# Patient Record
Sex: Male | Born: 1970
Health system: Southern US, Academic
[De-identification: ages and names within clinical notes are randomized; demographics above are authoritative.]

## PROBLEM LIST (undated history)

## (undated) ENCOUNTER — Ambulatory Visit

## (undated) ENCOUNTER — Encounter

## (undated) ENCOUNTER — Telehealth
Attending: Student in an Organized Health Care Education/Training Program | Primary: Student in an Organized Health Care Education/Training Program

## (undated) ENCOUNTER — Encounter: Attending: Infectious Disease | Primary: Infectious Disease

## (undated) ENCOUNTER — Ambulatory Visit: Payer: PRIVATE HEALTH INSURANCE | Attending: Clinical | Primary: Clinical

## (undated) ENCOUNTER — Telehealth

## (undated) ENCOUNTER — Encounter
Attending: Student in an Organized Health Care Education/Training Program | Primary: Student in an Organized Health Care Education/Training Program

## (undated) ENCOUNTER — Ambulatory Visit: Attending: Pharmacist | Primary: Pharmacist

## (undated) ENCOUNTER — Encounter: Attending: Clinical | Primary: Clinical

## (undated) ENCOUNTER — Telehealth: Attending: Infectious Disease | Primary: Infectious Disease

## (undated) ENCOUNTER — Ambulatory Visit: Payer: PRIVATE HEALTH INSURANCE | Attending: Infectious Disease | Primary: Infectious Disease

## (undated) ENCOUNTER — Telehealth: Attending: Clinical | Primary: Clinical

## (undated) ENCOUNTER — Ambulatory Visit: Payer: PRIVATE HEALTH INSURANCE

## (undated) DIAGNOSIS — D849 Immunodeficiency, unspecified: Secondary | ICD-10-CM

## (undated) DIAGNOSIS — I10 Essential (primary) hypertension: Secondary | ICD-10-CM

## (undated) DIAGNOSIS — Z21 Asymptomatic human immunodeficiency virus [HIV] infection status: Secondary | ICD-10-CM

## (undated) DIAGNOSIS — B20 Human immunodeficiency virus [HIV] disease: Secondary | ICD-10-CM

## (undated) HISTORY — PX: LAPAROSCOPIC GASTRIC SLEEVE RESECTION: SHX5895

## (undated) HISTORY — DX: Human immunodeficiency virus (HIV) disease: B20

## (undated) HISTORY — DX: Asymptomatic human immunodeficiency virus (hiv) infection status: Z21

## (undated) MED ORDER — LOSARTAN 50 MG TABLET: Freq: Every day | ORAL | 0 days

---

## 2012-03-29 DIAGNOSIS — K648 Other hemorrhoids: Secondary | ICD-10-CM | POA: Insufficient documentation

## 2016-09-13 ENCOUNTER — Ambulatory Visit: Payer: Self-pay | Admitting: Physician Assistant

## 2016-10-11 ENCOUNTER — Ambulatory Visit: Payer: Self-pay | Admitting: Physician Assistant

## 2016-10-11 ENCOUNTER — Encounter (INDEPENDENT_AMBULATORY_CARE_PROVIDER_SITE_OTHER): Payer: Self-pay

## 2016-10-11 ENCOUNTER — Encounter: Payer: Self-pay | Admitting: Physician Assistant

## 2016-10-11 VITALS — BP 142/80 | HR 55 | Temp 97.7°F | Ht 69.0 in | Wt 191.0 lb

## 2016-10-11 DIAGNOSIS — B1910 Unspecified viral hepatitis B without hepatic coma: Secondary | ICD-10-CM | POA: Insufficient documentation

## 2016-10-11 DIAGNOSIS — R7303 Prediabetes: Secondary | ICD-10-CM | POA: Insufficient documentation

## 2016-10-11 DIAGNOSIS — B20 Human immunodeficiency virus [HIV] disease: Secondary | ICD-10-CM | POA: Insufficient documentation

## 2016-10-11 DIAGNOSIS — E781 Pure hyperglyceridemia: Secondary | ICD-10-CM | POA: Insufficient documentation

## 2016-10-11 DIAGNOSIS — Z0189 Encounter for other specified special examinations: Secondary | ICD-10-CM

## 2016-10-11 DIAGNOSIS — Z008 Encounter for other general examination: Secondary | ICD-10-CM

## 2016-10-11 DIAGNOSIS — Z0000 Encounter for general adult medical examination without abnormal findings: Secondary | ICD-10-CM

## 2016-10-11 NOTE — Progress Notes (Signed)
S: pt here for wellness physical and biometrics for insurance purposes, no complaints ros neg. PMH: hiv, nondetectable viral load, normal cd4 count   Social: nonsmoker, occasional etoh, no drugs  Fam: DM, HTN, leukemia  O: vitals wnl, nad, ENT wnl, neck supple no lymph, lungs c t a, cv rrr, abd soft nontender bs normal all 4 quads  A: wellness, biometric physical  P: labs today, f/u with reg doctor as needed

## 2016-10-12 LAB — CMP12+LP+TP+TSH+6AC+PSA+CBC…
A/G RATIO: 1.6 (ref 1.2–2.2)
ALBUMIN: 4.7 g/dL (ref 3.5–5.5)
ALK PHOS: 80 IU/L (ref 39–117)
ALT: 17 IU/L (ref 0–44)
AST: 20 IU/L (ref 0–40)
BASOS ABS: 0 10*3/uL (ref 0.0–0.2)
BUN/Creatinine Ratio: 12 (ref 9–20)
BUN: 14 mg/dL (ref 6–24)
Basos: 1 %
Bilirubin Total: 0.5 mg/dL (ref 0.0–1.2)
CALCIUM: 9.6 mg/dL (ref 8.7–10.2)
CHOLESTEROL TOTAL: 192 mg/dL (ref 100–199)
Chloride: 103 mmol/L (ref 96–106)
Chol/HDL Ratio: 3.6 ratio (ref 0.0–5.0)
Creatinine, Ser: 1.16 mg/dL (ref 0.76–1.27)
EOS (ABSOLUTE): 0 10*3/uL (ref 0.0–0.4)
Eos: 1 %
Estimated CHD Risk: 0.6 times avg. (ref 0.0–1.0)
FREE THYROXINE INDEX: 1.6 (ref 1.2–4.9)
GFR calc Af Amer: 86 mL/min/{1.73_m2} (ref >59)
GFR calc non Af Amer: 75 mL/min/{1.73_m2} (ref >59)
GGT: 22 IU/L (ref 0–65)
GLOBULIN, TOTAL: 2.9 g/dL (ref 1.5–4.5)
Glucose: 84 mg/dL (ref 65–99)
HDL: 54 mg/dL (ref >39)
HEMOGLOBIN: 14.1 g/dL (ref 13.0–17.7)
Hematocrit: 42.9 % (ref 37.5–51.0)
IMMATURE GRANULOCYTES: 0 %
Immature Grans (Abs): 0 10*3/uL (ref 0.0–0.1)
Iron: 109 ug/dL (ref 38–169)
LDH: 154 IU/L (ref 121–224)
LDL CALC: 118 mg/dL — AB (ref 0–99)
LYMPHS ABS: 2.2 10*3/uL (ref 0.7–3.1)
LYMPHS: 40 %
MCH: 27.4 pg (ref 26.6–33.0)
MCHC: 32.9 g/dL (ref 31.5–35.7)
MCV: 84 fL (ref 79–97)
MONOS ABS: 0.4 10*3/uL (ref 0.1–0.9)
Monocytes: 8 %
NEUTROS PCT: 50 %
Neutrophils Absolute: 2.7 10*3/uL (ref 1.4–7.0)
PLATELETS: 202 10*3/uL (ref 150–379)
PROSTATE SPECIFIC AG, SERUM: 0.3 ng/mL (ref 0.0–4.0)
Phosphorus: 3.2 mg/dL (ref 2.5–4.5)
Potassium: 4.3 mmol/L (ref 3.5–5.2)
RBC: 5.14 x10E6/uL (ref 4.14–5.80)
RDW: 14.3 % (ref 12.3–15.4)
Sodium: 144 mmol/L (ref 134–144)
T3 Uptake Ratio: 26 % (ref 24–39)
T4 TOTAL: 6.3 ug/dL (ref 4.5–12.0)
TRIGLYCERIDES: 102 mg/dL (ref 0–149)
TSH: 1.85 u[IU]/mL (ref 0.450–4.500)
Total Protein: 7.6 g/dL (ref 6.0–8.5)
Uric Acid: 5.7 mg/dL (ref 3.7–8.6)
VLDL Cholesterol Cal: 20 mg/dL (ref 5–40)
WBC: 5.3 10*3/uL (ref 3.4–10.8)

## 2017-01-16 ENCOUNTER — Ambulatory Visit: Payer: Self-pay | Admitting: Physician Assistant

## 2017-01-17 ENCOUNTER — Ambulatory Visit: Payer: Self-pay | Admitting: Physician Assistant

## 2017-01-17 ENCOUNTER — Encounter: Payer: Self-pay | Admitting: Physician Assistant

## 2017-01-17 VITALS — BP 150/89 | HR 63 | Temp 98.5°F | Resp 16

## 2017-01-17 DIAGNOSIS — H1132 Conjunctival hemorrhage, left eye: Secondary | ICD-10-CM

## 2017-01-17 NOTE — Progress Notes (Signed)
S: c/o left eye being red for 2 days, states he woke up and his eye was red, no known trauma, no pain, no drainage, matting, or itching  O: vitals wnl, perrl eomi, left eye has small hemorrhage in  lateral aspect, neck supple no lymph, lungs c t a, cv rrr  A: subconjunctival hemorrhage  P: reassurance, return if any problems

## 2017-04-06 ENCOUNTER — Ambulatory Visit: Payer: Self-pay | Admitting: Physician Assistant

## 2017-04-06 ENCOUNTER — Emergency Department
Admission: EM | Admit: 2017-04-06 | Discharge: 2017-04-06 | Disposition: A | Discharge status: Home / Self Care | Payer: PRIVATE HEALTH INSURANCE | Source: Intra-hospital | Admitting: Family Medicine

## 2017-04-09 ENCOUNTER — Encounter: Payer: Self-pay | Admitting: Physician Assistant

## 2017-04-09 ENCOUNTER — Ambulatory Visit: Payer: Self-pay | Admitting: Physician Assistant

## 2017-04-09 VITALS — BP 149/80 | HR 63 | Temp 98.5°F | Resp 16

## 2017-04-09 DIAGNOSIS — R197 Diarrhea, unspecified: Secondary | ICD-10-CM

## 2017-04-09 LAB — POCT URINALYSIS DIPSTICK
Blood, UA: NEGATIVE
Glucose, UA: NEGATIVE
Ketones, UA: NEGATIVE
Leukocytes, UA: NEGATIVE
Nitrite, UA: NEGATIVE
PH UA: 5.5 (ref 5.0–8.0)
UROBILINOGEN UA: 0.2 U/dL

## 2017-04-09 MED ORDER — CIPROFLOXACIN HCL 500 MG PO TABS: 500.0000 mg | ORAL_TABLET | Freq: Two times a day (BID) | ORAL | 0 refills | Status: DC

## 2017-04-09 MED ORDER — METRONIDAZOLE 500 MG PO TABS: 500.0000 mg | ORAL_TABLET | Freq: Three times a day (TID) | ORAL | 0 refills | Status: DC

## 2017-04-09 NOTE — Addendum Note (Signed)
Addended by: Catha BrowEACON, MONIQUE T on: 04/09/2017 01:54 PM   Modules accepted: Orders

## 2017-04-09 NOTE — Progress Notes (Signed)
S:  Pt c/o vomiting x 1, then diarrhea for close to a week, got dehydrated and went to the ER in [place], they gave him fluids and did stool cultures, results have not come back as far as he knows, has a friend with the same sx; , no fever/chills, no abd pain except for cramping with diarrhea; denies cp/sob, denies camping, bad food, recent antibiotics, or exposure to bad water Remainder ros neg  O:  Vitals wnl, nad, ENT wnl, neck supple no lymph, lungs c t a, cv rrr, abd soft nontender bs normal all 4 quads b/l, neuro intact  A:  diarrhea  P:  Reassurance, fluids, brat diet, immodium ad for diarrhea if needed, rx cipro 500mg  bid x 7d, flagyl 500mg  tid, call if worsening or go to ER

## 2017-07-04 MED ORDER — DARUNAVIR 800 MG-COBICISTAT 150 MG TABLET
ORAL_TABLET | Freq: Every day | ORAL | 1 refills | 0 days | Status: CP
Start: 2017-07-04 — End: 2017-08-06

## 2017-07-04 MED ORDER — TENOFOVIR DISOPROXIL FUMARATE 300 MG TABLET
ORAL_TABLET | Freq: Every day | ORAL | 1 refills | 0 days | Status: CP
Start: 2017-07-04 — End: 2017-08-06

## 2017-07-04 MED ORDER — DOLUTEGRAVIR 50 MG TABLET
ORAL_TABLET | Freq: Every day | ORAL | 1 refills | 0 days | Status: CP
Start: 2017-07-04 — End: 2017-08-06

## 2017-08-06 ENCOUNTER — Ambulatory Visit
Admit: 2017-08-06 | Discharge: 2017-08-06 | Payer: PRIVATE HEALTH INSURANCE | Attending: Infectious Disease | Primary: Infectious Disease

## 2017-08-06 ENCOUNTER — Ambulatory Visit: Admit: 2017-08-06 | Discharge: 2017-08-06 | Payer: PRIVATE HEALTH INSURANCE | Attending: Clinical | Primary: Clinical

## 2017-08-06 DIAGNOSIS — B20 Human immunodeficiency virus [HIV] disease: Secondary | ICD-10-CM

## 2017-08-06 DIAGNOSIS — B181 Chronic viral hepatitis B without delta-agent: Secondary | ICD-10-CM

## 2017-08-06 DIAGNOSIS — Z113 Encounter for screening for infections with a predominantly sexual mode of transmission: Principal | ICD-10-CM

## 2017-08-06 MED ORDER — DOLUTEGRAVIR 50 MG TABLET: 50 mg | tablet | Freq: Every day | 6 refills | 0 days | Status: AC

## 2017-08-06 MED ORDER — DARUNAVIR 800 MG-COBICISTAT 150 MG TABLET
ORAL_TABLET | Freq: Every day | ORAL | 6 refills | 0.00000 days | Status: CP
Start: 2017-08-06 — End: 2017-09-10

## 2017-08-06 MED ORDER — DARUNAVIR 800 MG-COBICISTAT 150 MG TABLET: 1 | tablet | Freq: Every day | 6 refills | 0 days | Status: AC

## 2017-08-06 MED ORDER — TENOFOVIR DISOPROXIL FUMARATE 300 MG TABLET: 300 mg | tablet | Freq: Every day | 6 refills | 0 days | Status: AC

## 2017-08-06 MED ORDER — DOLUTEGRAVIR 50 MG TABLET
ORAL_TABLET | Freq: Every day | ORAL | 6 refills | 0.00000 days | Status: CP
Start: 2017-08-06 — End: 2017-08-06

## 2017-08-06 MED ORDER — TENOFOVIR DISOPROXIL FUMARATE 300 MG TABLET
ORAL_TABLET | Freq: Every day | ORAL | 6 refills | 0.00000 days | Status: CP
Start: 2017-08-06 — End: 2017-09-10

## 2017-08-29 NOTE — Unmapped (Signed)
Per test claim for PREZCOBIX, TENOFOVIR, AND TIVICAY at the [facility], patient needs Medication Assistance Program for High Copay.

## 2017-09-10 MED ORDER — BICTEGRAVIR 50 MG-EMTRICITABINE 200 MG-TENOFOVIR ALAFENAM 25 MG TABLET: 1 | tablet | 6 refills | 0 days

## 2017-09-10 MED ORDER — BICTEGRAVIR 50 MG-EMTRICITABINE 200 MG-TENOFOVIR ALAFENAM 25 MG TABLET
ORAL_TABLET | Freq: Every day | ORAL | 6 refills | 0.00000 days | Status: CP
Start: 2017-09-10 — End: 2018-01-07

## 2017-09-10 MED FILL — BIKTARVY/50-200-25MG/TABS: BIKTARVY/50-200-25MG/TABS | 30 days supply | Qty: 30 | Fill #0

## 2017-09-10 NOTE — Unmapped (Signed)
Per test claim for Biktarvy at the [facility], patient needs Medication Assistance Program for High Copay.

## 2017-09-10 NOTE — Unmapped (Signed)
[facility]   Patient Onboarding/Medication Counseling    Gary Campbell is a 47 y.o. male with HIV who I am counseling today on initiation of therapy.    Medication: Gary Campbell    Verified patient's date of birth / HIPAA.      Education Provided: ??    Patient was counseled by Gary Campbell CPP.   Gary Campbell did not have any questions.    Handling precautions / disposal reviewed:  n/a.    Drug Interactions: other medications reviewed and up to date in Epic.  No drug interactions identified.    Comorbidities/Allergies: reviewed and up to date in Epic.    Verified therapy is appropriate and should continue      Delivery Information    Medication Campbell provided: Gary Campbell    Anticipated copay of $0.00 reviewed with patient. Verified delivery address in FSI and reviewed medication storage requirement.    Scheduled delivery date: 09/11/17    Explained that we ship using UPS or courier and this shipment will not require a signature.      Explained the services we provide at [facility] and that each month we would call to set up refills.  Stressed importance of returning phone calls so that we could ensure they receive their medications in time each month.  Informed patient that we should be setting up refills 7-10 days prior to when they will run out of medication.  Informed patient that welcome packet will be sent.      Patient verbalized understanding of the above information as well as how to contact the [facility] at [phone] option [phone] with any questions/concerns.  The [facility] is open Monday through Friday 8:30am-4:30pm.  A pharmacist is available 24/7 via pager to answer any clinical questions they may have.        Patient Specific Needs      ? Patient has no physical, cognitive, or cultural barriers.    ? Patient prefers to have medications discussed with  Patient     ? Patient is able to read and understand education materials at a high school level or above.    ? Patient's primary language is  Gary Campbell  [facility] Specialty Pharmacist

## 2017-09-10 NOTE — Unmapped (Signed)
[facility] Special[facility] Medication Referral: No PA required    Medication (Brand/Generic): BIKTARVY    Initial FSI Test Claim completed with resulted information below:  No PA required  Patient ABLE to fill at [facility]  Insurance Company:  [employer]  Anticipated Copay: $0 WITH COPAY CARD    As Co-pay is under $100 defined limit, per policy there will be no further investigation of need for financial assistance at this time unless patient requests. This referral has been communicated to the provider and handed off to the [facility] team for further processing and filling of prescribed medication.   ______________________________________________________________________  Please utilize this referral for viewing purposes as it will serve as the central location for all relevant documentation and updates.

## 2017-09-10 NOTE — Unmapped (Signed)
[name] is a 46 YOM who is managed at [facility] for HIV/HBV coinfected. He was previously seen at [facility]. He has transferred care to [facility] and as a result there is limited data available on his previous care. He is currently taking TDF + DTG +DRV/co. Unfortunately, [name] is experiencing diarrhea that is interfering with his ADL. He stopped all ART 09/05/17 stating that he could not take the diarrhea any more. He is reporting some improvement in his bowel habits after stopping his meds.     After discussion with his provider, the decision was made to change him to [facility] once daily (without regards to meals). I called the patient to request financial information on behalf of the [facility] Medication Assistance technician, [facility], who was having a difficult time contacting him. Additionally,  I also counseled the patient on the new medication. We discussed administration, adverse effects, and adherence.    Given the patient co-infected status, it is necessary for the patient to remain on therapy and minimize treatment interruptions. The patient has currently been off of meds x 6 days. Therefore, I will work with [facility] to have his new medication sent to him as soon as possible.     Time Spent During Encounter: 20 minutes    Tonie Griffith, PharmD, BCPS, AAHIVP, CPP  Infectious Disease Clinical Pharmacy Practitioner   [facility]   Direct line: [phone]

## 2017-10-02 NOTE — Unmapped (Signed)
[facility] Refill and Clinical Coordination Note: HIV     HIV Medication(s): Biktarvy  Additional Medication(s): none    Gary Campbell, DOB: 01-28-1971  Phone: [phone] (home) [phone] (work), Alternate phone contact: N/A  Shipping address: [address]  [place] [address]  Phone or address changes today?: No  All above HIPAA information verified.  Insurance changes? No    Completed refill and clinical call assessment today to schedule patient's medication shipment from the [facility] [phone]).      MEDICATION RECONCILIATION    Confirmed the medication and dosage are correct and have not changed: Yes, regimen is correct and unchanged.    Were there any changes to your medication(s) in the past month:  No, there are no changes reported at this time.    HIV-related labs:  Lab Results   Component Value Date/Time    HIVRS Not Detected 08/06/2017 10:33 AM    HIVCM  08/06/2017 10:33 AM      Comment:      HIV-1 quantification by real-time RT-PCR is performed using the Abbott RealTime  HIV-1 test. This test is FDA approved and can quantitate HIV-1 RNA over the  range of 40 - 1,000,000 copies/mL (1.60 log(10) -6.00 log(10) copies/mL). The reference range for this assay is Not Detected.    ACD4 325 (L) 08/06/2017 10:33 AM         ADHERENCE      Number of tablets of HIV Medication(s) at home: 7    Did you miss any doses in the past 4 weeks? No missed doses reported.  Adherence counseling provided? Not needed     SIDE EFFECT MANAGEMENT    Are you tolerating your medication?:  Gary Campbell reports side effects of mild headache in the morning but it is getting better.  Side effect management discussed: None      Therapy is appropriate and should be continued.    The patient will receive an FSI print out for each medication shipped and additional FDA Medication Guides as required.  Patient education from [employer] or Robet Leu may also be included in the shipment.    Evidence of clinical benefit: Unable to determine yet because he has only been on Biktarvy since 09/14/17 and lab tests to assess efficacy have not been performed.      FINANCIAL/SHIPPING    Delivery Scheduled: Yes, Expected medication delivery date: 10/04/17   Additional medications refilled: No additional medications/refills needed at this time.    Gary Campbell did not have any additional questions at this time.      Delivery address validated in FSI scheduling system: Yes, address listed above is correct.      We will follow up with patient monthly for standard refill processing and delivery.      Thank you,  Roderic Palau   [facility] Specialty Pharmacist

## 2017-10-03 MED FILL — BIKTARVY/50-200-25MG/TABS: BIKTARVY/50-200-25MG/TABS | 30 days supply | Qty: 30 | Fill #1

## 2017-11-06 NOTE — Unmapped (Signed)
[facility] Pharm[facility] Refill Coordination Note  Medication: BIKTARVY    Unable to reach patient to schedule shipment for medication being filled at [facility]. Left voicemail on phone.  As this is the 3rd unsuccessful attempt to reach the patient, no additional phone call attempts will be made at this time.      Phone numbers attempted: [phone]  ALSO Gary Campbell  Last scheduled delivery: SHIPPED 4/24    Please call the [facility] at [phone] (option [phone]) should you have any further questions.      Thanks,  [facility] Specialty Team

## 2017-11-09 ENCOUNTER — Ambulatory Visit
Admit: 2017-11-09 | Discharge: 2017-11-10 | Payer: PRIVATE HEALTH INSURANCE | Attending: Infectious Disease | Primary: Infectious Disease

## 2017-11-09 DIAGNOSIS — Z113 Encounter for screening for infections with a predominantly sexual mode of transmission: Secondary | ICD-10-CM

## 2017-11-09 DIAGNOSIS — B181 Chronic viral hepatitis B without delta-agent: Secondary | ICD-10-CM

## 2017-11-09 DIAGNOSIS — B20 Human immunodeficiency virus [HIV] disease: Principal | ICD-10-CM

## 2017-11-09 DIAGNOSIS — F5101 Primary insomnia: Secondary | ICD-10-CM

## 2017-11-09 LAB — COMPREHENSIVE METABOLIC PANEL
ALBUMIN: 4.4 g/dL (ref 3.5–5.0)
ALKALINE PHOSPHATASE: 69 U/L (ref 38–126)
ALT (SGPT): 22 U/L (ref 19–72)
ANION GAP: 10 mmol/L (ref 9–15)
AST (SGOT): 25 U/L (ref 19–55)
BILIRUBIN TOTAL: 0.6 mg/dL (ref 0.0–1.2)
BLOOD UREA NITROGEN: 9 mg/dL (ref 7–21)
BUN / CREAT RATIO: 10
CALCIUM: 9.5 mg/dL (ref 8.5–10.2)
CHLORIDE: 103 mmol/L (ref 98–107)
CO2: 29 mmol/L (ref 22.0–30.0)
CREATININE: 0.87 mg/dL (ref 0.70–1.30)
EGFR MDRD AF AMER: >=60 mL/min/{1.73_m2} (ref >=60)
EGFR MDRD NON AF AMER: >=60 mL/min/{1.73_m2} (ref >=60)
GLUCOSE RANDOM: 81 mg/dL (ref 65–179)
PROTEIN TOTAL: 7.9 g/dL (ref 6.5–8.3)
SODIUM: 142 mmol/L (ref 135–145)

## 2017-11-09 LAB — LEUKOCYTE ESTERASE UA: Lab: NEGATIVE

## 2017-11-09 LAB — CBC W/ AUTO DIFF
BASOPHILS ABSOLUTE COUNT: 0.1 10*9/L (ref 0.0–0.1)
BASOPHILS RELATIVE PERCENT: 1.1 %
EOSINOPHILS RELATIVE PERCENT: 0.6 %
HEMATOCRIT: 47.7 % (ref 41.0–53.0)
LARGE UNSTAINED CELLS: 3 % (ref 0–4)
LYMPHOCYTES ABSOLUTE COUNT: 1.3 10*9/L — ABNORMAL LOW (ref 1.5–5.0)
MEAN CORPUSCULAR HEMOGLOBIN CONC: 31.5 g/dL (ref 31.0–37.0)
MEAN CORPUSCULAR HEMOGLOBIN: 27.4 pg (ref 26.0–34.0)
MEAN CORPUSCULAR VOLUME: 87 fL (ref 80.0–100.0)
MEAN PLATELET VOLUME: 8.4 fL (ref 7.0–10.0)
MONOCYTES ABSOLUTE COUNT: 0.4 10*9/L (ref 0.2–0.8)
NEUTROPHILS ABSOLUTE COUNT: 3.7 10*9/L (ref 2.0–7.5)
NEUTROPHILS RELATIVE PERCENT: 65.1 %
PLATELET COUNT: 207 10*9/L (ref 150–440)
RED BLOOD CELL COUNT: 5.48 10*12/L (ref 4.50–5.90)
RED CELL DISTRIBUTION WIDTH: 13.1 % (ref 12.0–15.0)
WBC ADJUSTED: 5.7 10*9/L (ref 4.5–11.0)

## 2017-11-09 LAB — LYMPH MARKER LIMITED,FLOW
ABSOLUTE CD3 CNT: 986 {cells}/uL (ref 915–3400)
ABSOLUTE CD4 CNT: 279 {cells}/uL — ABNORMAL LOW (ref 510–2320)
CD3% (T CELLS)": 67 % (ref 61–86)
CD4% (T HELPER)": 19 % — ABNORMAL LOW (ref 34–58)
CD4:CD8 RATIO: 0.4 — ABNORMAL LOW (ref 0.9–4.8)
CD8% T SUPPRESR": 46 % — ABNORMAL HIGH (ref 12–38)

## 2017-11-09 LAB — FREE T4: Thyroxine.free:MCnc:Pt:Ser/Plas:Qn:: 0.97

## 2017-11-09 LAB — PLATELET COUNT: Lab: 207

## 2017-11-09 LAB — URINALYSIS
BACTERIA: NONE SEEN /HPF
BILIRUBIN UA: NEGATIVE
BLOOD UA: NEGATIVE
GLUCOSE UA: NEGATIVE
LEUKOCYTE ESTERASE UA: NEGATIVE
NITRITE UA: NEGATIVE
PH UA: 6 (ref 5.0–9.0)
RBC UA: <1 /HPF (ref <=3)
SPECIFIC GRAVITY UA: 1.019 (ref 1.003–1.030)
SQUAMOUS EPITHELIAL: <1 /HPF (ref 0–5)
UROBILINOGEN UA: 0.2
WBC UA: 1 /HPF (ref <=2)

## 2017-11-09 LAB — ABSOLUTE CD4 CNT: Cells.CD3+CD4+:NCnc:Pt:XXX:Qn:: 279 — ABNORMAL LOW

## 2017-11-09 LAB — THYROID STIMULATING HORMONE: Thyrotropin:ACnc:Pt:Ser/Plas:Qn:: 1.18

## 2017-11-09 LAB — CO2: Carbon dioxide:SCnc:Pt:Ser/Plas:Qn:: 29

## 2017-11-09 MED ORDER — HYDROCHLOROTHIAZIDE 25 MG TABLET
ORAL_TABLET | Freq: Every day | ORAL | 11 refills | 0.00000 days | Status: CP
Start: 2017-11-09 — End: 2018-07-08

## 2017-11-09 NOTE — Unmapped (Signed)
PCP:  Matilde Haymaker, MD    11/09/2017    This is a RETURN visit for this 47 y.o. male with the following diagnoses:    Patient Active Problem List    Diagnosis Date Noted   ??? HIV disease (CMS-HCC) 08/06/2017   ??? HBV (hepatitis B virus) infection 08/06/2017       ASSESSMENT/PLAN:    HIV  ?? Dx'd in 1989.   ?? Was cared for here until ~2003. Most recently cared for at [facility] by Matilde Haymaker  ?? Was on Viread+Tivicay+Prezcobix when switched over from [facility]  ?? Has HBV co-infection  ?? Documented K103N and 184V  ?? HIV RNA was <20  ?? CD4 325 at time  ?? I changed him to [facility] which he started 1 month ago  ?? Says he has had slowly progressing HA but also increase in BP/ Today is 180/108. Has hx of elevated BP that was improved with gastric sleeve 3 years ago. See below  ?? PLAN:  ?? HA could be due to [facility] but also elevated BP.   Will see if HA improves with BP treatment  ?? If not, consider TDF/FTC + Dolutegravir (which he tolerated) and eventual switch to TAF/FTC if tolerated.   ?? Checking HIV related and safety labs today.    HTN  ?? Hx in past  ?? Was on BP med that caused cough (ACEi probably)   ?? Then got gastric sleeve and wt dropped as did BP  ?? Has strong family hx  ?? Will try HCTZ 25 mg daily to start  ?? He will measure BP at home and message results to me in MyChart.   ?? Check UA    HBV Ag+; DNA undetectable  ?? Surface antigen positive  ?? HBV DNA undetectable.   ?? Core total was neg  ?? Will check e antigen and antibody and repeat core antibody  ?? Needs q6 month ultrasounds. Will order.    Insomnia  ?? Good sleep hygiene. Only 1 cup am coffee and no other caffeine   ?? Started before Tivicay and after partner's death  ?? Issue is falling asleep  ?? Melatonin OTC did not work  ?? Sleeps ~4 hours per night. Trouble falling asleep.  ?? BUT, says he feels rested in am.   ?? He may be someone who needs relatively little sleep. Will ask Sleep Clinic to opine.     Mental Health  ?? Stable and good    Health Maintenance   ?? Immunizations: Had flu shot. Get [facility]  ?? Cancer Screening: Family hx of early colon cancer. Has had colonoscopy and may be due for repeat. US Liver.   ?? Lipids: Check  ?? Other:   ?? Had gastric sleeve 3 years ago. Has kept wt off. Not on PPIs.  ?? Follow BP  ?? No meds other than ART  ?? Has 3/6 systolic murmur - known and has had echocardiogram he said without concerns.     Will follow up in 1 month.       Chief Complaint: Urgent HIV follow-up    HPI:  [name] returns for an urgent appointment. Has HA and elevated BP. He is concerned that this is related to his new HIV med.       ROS:   No fever chills, sweats, nausea, vomiting, diarrhea, rash, joint pain. Pos for headache (new), insomnia (chronic)    All other systems are negative.    ALLERGIES:  Patient has no known allergies.    MEDICATIONS:  Current Outpatient Medications   Medication Sig Dispense Refill   ??? bictegrav-emtricit-tenofov ala (BIKTARVY) 50-200-25 mg tablet Take 1 tablet by mouth daily. 30 tablet 6   ??? ibuprofen (ADVIL,MOTRIN) 200 MG tablet Take 400 mg by mouth every six (6) hours as needed for pain.     ??? hydroCHLOROthiazide (HYDRODIURIL) 25 MG tablet Take 1 tablet (25 mg total) by mouth daily. 30 tablet 11     No current facility-administered medications for this visit.        PHYSICAL EXAM:  Vitals:    11/09/17 1303   BP: 180/108   Pulse: 54   Temp: 37.1 ??C (98.7 ??F)     CONSTITUTIONAL: Alert,well appearing, no distress  HEENT: Moist mucous membranes, oropharynx clear without erythema or exudate  NECK: Supple, no lymphadenopathy  CARDIOVASCULAR: Regular, 3/6 SEM.   PULM: Clear to auscultation bilaterally  GASTROINTESTINAL: Soft, active bowel sounds, nontender  EXTREMITIES: No lower extremity edema bilaterally, dorsalis pedis pulses 2+ bilaterally   SKIN: No rashes or lesions  NEUROLOGIC: No focal motor or sensory deficits    DATA:    Results for orders placed or performed in visit on 08/06/17   Comprehensive Metabolic Panel   Result Value Ref Range    Sodium 142 135 - 145 mmol/L    Potassium 3.9 3.5 - 5.0 mmol/L    Chloride 104 98 - 107 mmol/L    CO2 32.0 (H) 22.0 - 30.0 mmol/L    BUN 16 7 - 21 mg/dL    Creatinine 1.61 0.96 - 1.30 mg/dL    BUN/Creatinine Ratio 13     EGFR MDRD Non Af Amer >=60 >=60 mL/min/1.33m2    EGFR MDRD Af Amer >=60 >=60 mL/min/1.74m2    Anion Gap 6 (L) 9 - 15 mmol/L    Glucose 70 65 - 179 mg/dL    Calcium 9.6 8.5 - 04.5 mg/dL    Albumin 4.5 3.5 - 5.0 g/dL    Total Protein 7.7 6.5 - 8.3 g/dL    Total Bilirubin 0.6 0.0 - 1.2 mg/dL    AST 28 19 - 55 U/L    ALT 25 19 - 72 U/L    Alkaline Phosphatase 62 38 - 126 U/L   Hepatitis B Surface Antigen   Result Value Ref Range    Hepatitis B Surface Ag (A) Nonreactive     For final results see Hepatitis B Surface Antigen Neutralization   Hepatitis B Surface Antibody   Result Value Ref Range    Hep B S Ab Nonreactive Nonreactive, Grayzone    Hepatitis B Surface Ab Quant <8.00 <8.00 m(IU)/mL   Hepatitis B Core Antibody, Total   Result Value Ref Range    Hep B Core Total Ab Nonreactive Nonreactive   Hepatitis B DNA, PCR, Quantitative   Result Value Ref Range    HBV DNA Quant Not Detected Not Detected    HBV DNA Comment     HEP.B NEUTRALIZATION   Result Value Ref Range    Hep B S Ag Neutralization (A) Reactive by screening assay;non-confirmable by neutralization assay     Hepatitis B Surface Antigen POSITIVE;confirmed by neutralization   CBC w/ Differential   Result Value Ref Range    WBC 5.2 4.5 - 11.0 10*9/L    RBC 4.99 4.50 - 5.90 10*12/L    HGB 13.6 13.5 - 17.5 g/dL    HCT 40.9 81.1 - 91.4 %    MCV 83.4  80.0 - 100.0 fL    MCH 27.3 26.0 - 34.0 pg    MCHC 32.7 31.0 - 37.0 g/dL    RDW 16.1 09.6 - 04.5 %    MPV 8.3 7.0 - 10.0 fL    Platelet 218 150 - 440 10*9/L    Neutrophils % 56.1 %    Lymphocytes % 30.9 %    Monocytes % 7.2 %    Eosinophils % 0.6 %    Basophils % 0.9 %    Absolute Neutrophils 2.9 2.0 - 7.5 10*9/L    Absolute Lymphocytes 1.6 1.5 - 5.0 10*9/L    Absolute Monocytes 0.4 0.2 - 0.8 10*9/L    Absolute Eosinophils 0.0 0.0 - 0.4 10*9/L    Absolute Basophils 0.1 0.0 - 0.1 10*9/L    Large Unstained Cells 4 0 - 4 %    Hypochromasia Slight (A) Not Present        Creatinine   Date Value Ref Range Status   08/06/2017 1.23 0.70 - 1.30 mg/dL Final   40/98/1191 4.78 0.70 - 1.30 mg/dL Final   29/56/2130 8.65 0.80 - 1.30 mg/dL Final     Comment:     Interpretive Data: * This creatinine method is traceable to a GC-IDMS method and NIST standard reference material. (Entered 04/06/2011 17:09:22 By George Hugh       Triglycerides   Date Value Ref Range Status   08/06/2017 122 1 - 149 mg/dL Final     HDL   Date Value Ref Range Status   08/06/2017 49 40 - 59 mg/dL Final     LDL Calculated   Date Value Ref Range Status   08/06/2017 127 (H) 60 - 99 mg/dL Final     Comment:       NHLBI Recommended Ranges, LDL Cholesterol, for Adults (20+yrs) (ATPIII), mg/dL  Optimal              <784  Near Optimal        100-129  Borderline High     130-159  High                160-189  Very High            >=190  NHLBI Recommended Ranges, LDL Cholesterol, for Children (2-19 yrs), mg/dL  Desirable            <696  Borderline High     110-129  High                 >=130         IMAGING STUDIES:  None

## 2017-11-09 NOTE — Unmapped (Signed)
[facility] Refill Coordination Note  Specialty Medication(s): Biktarvy  Additional Medications shipped: none    [name], DOB: [birth year]  Phone: [phone] (home) [phone] (work), Alternate phone contact: N/A  Phone or address changes today?: No  All above HIPAA information was verified with patient.  Shipping Address: [address]  [place] [address]   Insurance changes? No    Completed refill call assessment today to schedule patient's medication shipment from the [facility] [phone]).      Confirmed the medication and dosage are correct and have not changed: Yes, regimen is correct and unchanged.    Confirmed patient started or stopped the following medications in the past month:  No, there are no changes reported at this time.    Are you tolerating your medication?:  [name] reports tolerating the medication.    ADHERENCE    (Below is required for Medicare Part B or Transplant patients only - per drug):   How many tablets were dispensed last month: 30  Patient currently has 3 tablets remaining.    Did you miss any doses in the past 4 weeks? Yes.  [name] reports missing 4 days of medication therapy in the last 4 weeks.  [name] reports he was told to hold his dose until he spoke with his doctor as the cause of their non-adherance.    FINANCIAL/SHIPPING    Delivery Scheduled: Yes, Expected medication delivery date: 11/12/17     The patient will receive an FSI print out for each medication shipped and additional FDA Medication Guides as required.  Patient education from [employer] or Robet Leu may also be included in the shipment    [name] did not have any additional questions at this time.    Delivery address validated in FSI scheduling system: Yes, address listed in FSI is correct.    We will follow up with patient monthly for standard refill processing and delivery.      Thank you,  Roderic Palau   [facility] Specialty Pharmacist

## 2017-11-10 MED FILL — BIKTARVY/50-200-25MG/TABS: BIKTARVY/50-200-25MG/TABS | 30 days supply | Qty: 30 | Fill #2

## 2017-11-12 LAB — SYPHILIS RPR SCREEN: Reagin Ab:PrThr:Pt:Ser:Ord:RPR: NONREACTIVE

## 2017-11-12 LAB — HEPATITIS B CORE TOTAL ANTIBODY: Hepatitis B virus core Ab:PrThr:Pt:Ser/Plas:Ord:IA: NONREACTIVE

## 2017-11-13 LAB — HEPATITIS BE ANTIBODY: Hepatitis B virus little e Ab.IgG:PrThr:Pt:Ser:Ord:IA: NEGATIVE

## 2017-11-13 LAB — HIV RNA, QUANTITATIVE, PCR: HIV RNA: 41 {copies}/mL — ABNORMAL HIGH (ref <0)

## 2017-11-13 LAB — HIV RNA COMMENT: Lab: 0

## 2017-11-13 LAB — HEPATITIS BE ANTIGEN: Hepatitis B virus little e Ag:PrThr:Pt:Ser/Plas:Ord:IA: POSITIVE — AB

## 2017-11-22 LAB — HBV DNA: Hepatitis B virus DNA:ACnc:Pt:Ser/Plas:Qn:Probe.amp.tar: 22 — ABNORMAL HIGH

## 2017-11-22 LAB — HEPATITIS B DNA, ULTRAQUANTITATIVE, PCR: HBV DNA: 22 [IU]/mL — ABNORMAL HIGH

## 2017-11-27 ENCOUNTER — Ambulatory Visit: Payer: Self-pay | Admitting: Family Medicine

## 2017-11-27 VITALS — BP 154/99 | HR 66 | Resp 17 | Ht 69.0 in | Wt 200.0 lb

## 2017-11-27 DIAGNOSIS — Z008 Encounter for other general examination: Secondary | ICD-10-CM

## 2017-11-27 DIAGNOSIS — Z0189 Encounter for other specified special examinations: Principal | ICD-10-CM

## 2017-11-27 NOTE — Progress Notes (Signed)
Subjective: Annual biometrics screening  Patient presents for his annual biometric screening. Patient reports eating a healthy, well-rounded diet and getting regular physical activity.  Patient regularly sees his primary care provider. Patient works for [employer]. Patient denies any other issues or concerns.   Review of Systems Unremarkable  Objective  Physical Exam General: Awake, alert and oriented. No acute distress. Well developed, hydrated and nourished. Appears stated age.  HEENT: Supple neck without adenopathy. Sclera is non-icteric. The ear canal is clear without discharge. The tympanic membrane is normal in appearance with normal landmarks and cone of light. Nasal mucosa is pink and moist. Oral mucosa is pink and moist. The pharynx is normal in appearance without tonsillar swelling or exudates.  Skin: Skin in warm, dry and intact without rashes or lesions. Appropriate color for ethnicity. Cardiac: Heart rate and rhythm are normal. No murmurs, gallops, or rubs are auscultated.  Respiratory: The chest wall is symmetric and without deformity. No signs of respiratory distress. Lung sounds are clear in all lobes bilaterally without rales, ronchi, or wheezes.  Neurological: The patient is awake, alert and oriented to person, place, and time with normal speech.  Memory is normal and thought processes intact. No gait abnormalities are appreciated.  Psychiatric: Appropriate mood and affect.   Assessment Annual biometrics screening  Plan  Lipid panel and nonfasting blood sugar pending. Encouraged routine visits with primary care provider.  Patient blood pressure is 154/99 today.  Patient monitors this regularly and reports his blood pressure to his primary care provider.  Patient reports his blood pressure was in the 120s over 80s yesterday.  Advised patient to continue monitoring this regularly and report abnormal values with primary care provider.  Discussed normal values. Encouraged patient to  get regular exercise and eat a healthy, well-rounded diet.

## 2017-11-28 LAB — LIPID PANEL
CHOLESTEROL TOTAL: 201 mg/dL — AB (ref 100–199)
Chol/HDL Ratio: 4.3 ratio (ref 0.0–5.0)
HDL: 47 mg/dL (ref >39)
LDL CALC: 133 mg/dL — AB (ref 0–99)
Triglycerides: 104 mg/dL (ref 0–149)
VLDL Cholesterol Cal: 21 mg/dL (ref 5–40)

## 2017-11-28 LAB — GLUCOSE, RANDOM: Glucose: 79 mg/dL (ref 65–99)

## 2017-11-28 NOTE — Progress Notes (Signed)
David Mccann, Will you call the patient and inform them that their lipid panel and fasting blood sugar came back?  Everything is normal, with the exception of his total cholesterol and LDL cholesterol.  The total cholesterol is elevated at 201, normal values are between 100 and 199. The LDL cholesterol ("bad cholesterol") is elevated at 133, normal values are below 99.  These have both increased since last year.  Please advise the patient to follow-up with their primary care provider regarding these results.

## 2017-12-14 MED FILL — BIKTARVY/50-200-25MG/TABS: BIKTARVY/50-200-25MG/TABS | 30 days supply | Qty: 30 | Fill #3

## 2017-12-14 NOTE — Unmapped (Signed)
[facility] Pharm[facility] Refill Coordination Note    Specialty Medication(s) to be Shipped:   Infectious Disease: Biktarvy    Other medication(s) to be shipped: Gary Campbell, DOB: [birth year]  Phone: [phone] (home) [phone] (work)  Shipping Address: [address]  [place] [address]    All above HIPAA information was verified with patient.     Completed refill call assessment today to schedule patient's medication shipment from the [facility] [phone]).       Specialty medication(s) and dose(s) confirmed: Regimen is correct and unchanged.   Changes to medications: [name] reports no changes reported at this time.  Changes to insurance: No  Questions for the pharmacist: No    The patient will receive an FSI print out for each medication shipped and additional FDA Medication Guides as required.  Patient education from [employer] or Robet Leu may also be included in the shipment.    DISEASE-SPECIFIC INFORMATION        N/A    ADHERENCE     Medication Adherence    Patient reported X missed doses in the last month:  0          Refill Coordination    Has the Patients' Contact Information Changed:  No  Is the Shipping Address Different:  No         MEDICARE PART B DOCUMENTATION     Not Applicable    SHIPPING     Shipping address confirmed in FSI.     Delivery Scheduled: Yes, Expected medication delivery date: 12/17/17 via UPS or courier.     Mitzi Davenport   [facility] Specialty Technician

## 2018-01-07 ENCOUNTER — Ambulatory Visit
Admit: 2018-01-07 | Discharge: 2018-01-08 | Payer: PRIVATE HEALTH INSURANCE | Attending: Infectious Disease | Primary: Infectious Disease

## 2018-01-07 DIAGNOSIS — Z1211 Encounter for screening for malignant neoplasm of colon: Secondary | ICD-10-CM

## 2018-01-07 DIAGNOSIS — B181 Chronic viral hepatitis B without delta-agent: Secondary | ICD-10-CM

## 2018-01-07 DIAGNOSIS — B20 Human immunodeficiency virus [HIV] disease: Principal | ICD-10-CM

## 2018-01-07 LAB — COMPREHENSIVE METABOLIC PANEL
ALBUMIN: 4.3 g/dL (ref 3.5–5.0)
ALKALINE PHOSPHATASE: 65 U/L (ref 38–126)
ALT (SGPT): 21 U/L (ref 19–72)
ANION GAP: 5 mmol/L — ABNORMAL LOW (ref 9–15)
AST (SGOT): 28 U/L (ref 19–55)
BILIRUBIN TOTAL: 0.6 mg/dL (ref 0.0–1.2)
BLOOD UREA NITROGEN: 13 mg/dL (ref 7–21)
BUN / CREAT RATIO: 13
CALCIUM: 9.5 mg/dL (ref 8.5–10.2)
CHLORIDE: 98 mmol/L (ref 98–107)
CREATININE: 1 mg/dL (ref 0.70–1.30)
EGFR CKD-EPI AA MALE: >90 mL/min/{1.73_m2} (ref >=60)
EGFR CKD-EPI NON-AA MALE: 88 mL/min/{1.73_m2} (ref >=60)
POTASSIUM: 3.8 mmol/L (ref 3.5–5.0)
PROTEIN TOTAL: 7.6 g/dL (ref 6.5–8.3)
SODIUM: 138 mmol/L (ref 135–145)

## 2018-01-07 LAB — CBC W/ AUTO DIFF
BASOPHILS ABSOLUTE COUNT: 0.1 10*9/L (ref 0.0–0.1)
BASOPHILS RELATIVE PERCENT: 0.9 %
EOSINOPHILS ABSOLUTE COUNT: 0.1 10*9/L (ref 0.0–0.4)
EOSINOPHILS RELATIVE PERCENT: 1 %
HEMATOCRIT: 45 % (ref 41.0–53.0)
HEMOGLOBIN: 14.7 g/dL (ref 13.5–17.5)
LARGE UNSTAINED CELLS: 3 % (ref 0–4)
LYMPHOCYTES ABSOLUTE COUNT: 1.7 10*9/L (ref 1.5–5.0)
LYMPHOCYTES RELATIVE PERCENT: 32.6 %
MEAN CORPUSCULAR HEMOGLOBIN: 27.8 pg (ref 26.0–34.0)
MEAN CORPUSCULAR VOLUME: 85.4 fL (ref 80.0–100.0)
MONOCYTES ABSOLUTE COUNT: 0.4 10*9/L (ref 0.2–0.8)
MONOCYTES RELATIVE PERCENT: 8.5 %
NEUTROPHILS ABSOLUTE COUNT: 2.8 10*9/L (ref 2.0–7.5)
NEUTROPHILS RELATIVE PERCENT: 53.6 %
RED BLOOD CELL COUNT: 5.27 10*12/L (ref 4.50–5.90)
RED CELL DISTRIBUTION WIDTH: 13.1 % (ref 12.0–15.0)

## 2018-01-07 LAB — LYMPH MARKER LIMITED,FLOW
ABSOLUTE CD3 CNT: 1244 {cells}/uL (ref 915–3400)
CD3% (T CELLS)": 67 % (ref 61–86)
CD4% (T HELPER)": 17 % — ABNORMAL LOW (ref 34–58)
CD8% T SUPPRESR": 49 % — ABNORMAL HIGH (ref 12–38)

## 2018-01-07 LAB — BUN / CREAT RATIO: Urea nitrogen/Creatinine:MRto:Pt:Ser/Plas:Qn:: 13

## 2018-01-07 LAB — HBSAG NEUT: Hepatitis B virus surface Ag:PrThr:Pt:Ser/Plas:Ord:Neut: POSITIVE — AB

## 2018-01-07 LAB — HEPATITIS B SURFACE ANTIGEN

## 2018-01-07 LAB — BASOPHILS ABSOLUTE COUNT: Lab: 0.1

## 2018-01-07 LAB — ABSOLUTE CD4 CNT: Cells.CD3+CD4+:NCnc:Pt:XXX:Qn:: 316 — ABNORMAL LOW

## 2018-01-07 MED ORDER — HYDROCHLOROTHIAZIDE 25 MG TABLET
ORAL_TABLET | Freq: Every day | ORAL | 10 refills | 0 days | Status: CP
Start: 2018-01-07 — End: ?

## 2018-01-07 MED ORDER — SILDENAFIL 100 MG TABLET
ORAL_TABLET | Freq: Once | ORAL | 6 refills | 0 days | Status: CP | PRN
Start: 2018-01-07 — End: 2018-07-08

## 2018-01-07 MED ORDER — BICTEGRAVIR 50 MG-EMTRICITABINE 200 MG-TENOFOVIR ALAFENAM 25 MG TABLET
ORAL_TABLET | Freq: Every day | ORAL | 6 refills | 0.00000 days | Status: CP
Start: 2018-01-07 — End: 2018-01-29

## 2018-01-07 NOTE — Unmapped (Signed)
PCP:  Gary Haymaker, MD    01/07/2018    This is a RETURN visit for this 47 y.o. male with the following diagnoses:    Patient Active Problem List    Diagnosis Date Noted   ??? HIV disease (CMS-HCC) 08/06/2017   ??? HBV (hepatitis B virus) infection 08/06/2017       ASSESSMENT/PLAN:    HIV  ?? Dx'd in 1989.   ?? Was cared for here until ~2003. Most recently cared for at [facility] by Gary Campbell  ?? Was on Viread+Tivicay+Prezcobix when switched over from [facility]  ?? Has HBV co-infection  ?? Documented K103N and 184V  ?? HIV RNA was <20  ?? CD4 325 at time  ?? I changed him to [facility] which he started ~3 months ago  ?? Low level viremia last visit.  ?? PLAN:  ?? Recheck labs today  ?? If elevated will look at getting Archive DNA     HTN  ?? Better on HCTZ 25 mg daily  ?? Some ED since starting (lost am erections)  ?? Does not want to change his BP med  ?? Asks for trial of Viagra. If works daily Cialis may be an option if covered.     HBV Surface Ag+; e Antigen+; DNA detectable at low level  ?? Pre-core mutant   ?? On Biktarvy which contains TAF and FTC   ?? Needs q6 month ultrasounds. Will order.  ?? Recheck labs    Insomnia  ?? Good sleep hygiene. Only 1 cup am coffee and no other caffeine   ?? Started before Tivicay and after partner's death  ?? Issue is falling asleep  ?? Melatonin OTC did not work  ?? Sleeps ~4 hours per night. Trouble falling asleep.  ?? BUT, says he feels rested in am.   ?? He may be someone who needs relatively little sleep.     Mental Health  ?? Stable and good    Health Maintenance   ?? Immunizations: Had flu shot. Get records from [facility]  ?? Cancer Screening: Family hx of early colon cancer. Has had colonoscopy and due for repeat. US Liver.   ?? Lipids: Not horrible but lowish HDL. AASCVD 10y risk is 9.6%. Will discuss with him statin.  ?? Other:   ?? Had gastric sleeve 3 years ago. Has kept wt off. Not on PPIs.  ?? Follow BP  ?? No meds other than ART and HCTZ  ?? Has 3/6 systolic murmur - known and has had echocardiogram he said without concerns.     Will follow up in 6 months.       Chief Complaint: HIV follow-up    HPI:  Gary Campbell returns for an appointment.       ROS:   No fever chills, sweats, nausea, vomiting, diarrhea, rash, joint pain. Pos for  insomnia (chronic) and ED (new)    All other systems are negative.    ALLERGIES:  Patient has no known allergies.    MEDICATIONS:  Current Outpatient Medications   Medication Sig Dispense Refill   ??? bictegrav-emtricit-tenofov ala (BIKTARVY) 50-200-25 mg tablet Take 1 tablet by mouth daily. 30 tablet 6   ??? hydroCHLOROthiazide (HYDRODIURIL) 25 MG tablet Take 1 tablet (25 mg total) by mouth daily. 90 tablet 10   ??? ibuprofen (ADVIL,MOTRIN) 200 MG tablet Take 400 mg by mouth every six (6) hours as needed for pain.     ??? hydroCHLOROthiazide (HYDRODIURIL) 25 MG tablet Take 1 tablet (25  mg total) by mouth daily. 30 tablet 11   ??? sildenafil (VIAGRA) 100 MG tablet Take 1 tablet (100 mg total) by mouth once as needed for erectile dysfunction. for up to 1 dose 1 tablet 6     No current facility-administered medications for this visit.        PHYSICAL EXAM:  Vitals:    01/07/18 0817   BP: 134/86   Pulse: 55   Temp: 36.8 ??C (98.3 ??F)     CONSTITUTIONAL: Alert,well appearing, no distress  HEENT: Moist mucous membranes, oropharynx clear without erythema or exudate  NECK: Supple, no lymphadenopathy  CARDIOVASCULAR: Regular, 3/6 SEM.   PULM: Clear to auscultation bilaterally  GASTROINTESTINAL: Soft, active bowel sounds, nontender  EXTREMITIES: No lower extremity edema bilaterally, dorsalis pedis pulses 2+ bilaterally   SKIN: No rashes or lesions  NEUROLOGIC: No focal motor or sensory deficits    DATA:    Results for orders placed or performed in visit on 11/09/17   HIV RNA, Quantitative, PCR   Result Value Ref Range    HIV RNA Quant Result  Not Detected    HIV RNA 41 (H) <0 copies/mL    HIV RNA Log(10) 1.61 (H) <0.00 log copies/mL    HIV RNA Comment     Urinalysis   Result Value Ref Range    Color, UA Yellow Clarity, UA Clear     Specific Gravity, UA 1.019 1.003 - 1.030    pH, UA 6.0 5.0 - 9.0    Leukocyte Esterase, UA Negative Negative    Nitrite, UA Negative Negative    Protein, UA Trace (A) Negative    Glucose, UA Negative Negative    Ketones, UA Negative Negative    Urobilinogen, UA 0.2 mg/dL 0.2 mg/dL, 1.0 mg/dL    Bilirubin, UA Negative Negative    Blood, UA Negative Negative    RBC, UA <1 <=3 /HPF    WBC, UA 1 <=2 /HPF    Squam Epithel, UA <1 0 - 5 /HPF    Bacteria, UA None Seen None Seen /HPF    Mucus, UA Few (A) None Seen /HPF   RPR   Result Value Ref Range    RPR Nonreactive Nonreactive   Comprehensive Metabolic Panel   Result Value Ref Range    Sodium 142 135 - 145 mmol/L    Potassium 3.8 3.5 - 5.0 mmol/L    Chloride 103 98 - 107 mmol/L    CO2 29.0 22.0 - 30.0 mmol/L    BUN 9 7 - 21 mg/dL    Creatinine 1.61 0.96 - 1.30 mg/dL    BUN/Creatinine Ratio 10     EGFR MDRD Non Af Amer >=60 >=60 mL/min/1.51m2    EGFR MDRD Af Amer >=60 >=60 mL/min/1.24m2    Anion Gap 10 9 - 15 mmol/L    Glucose 81 65 - 179 mg/dL    Calcium 9.5 8.5 - 04.5 mg/dL    Albumin 4.4 3.5 - 5.0 g/dL    Total Protein 7.9 6.5 - 8.3 g/dL    Total Bilirubin 0.6 0.0 - 1.2 mg/dL    AST 25 19 - 55 U/L    ALT 22 19 - 72 U/L    Alkaline Phosphatase 69 38 - 126 U/L   Hepatitis B DNA, PCR, Quantitative   Result Value Ref Range    HBV DNA 22 (H) 0 IU/mL    HBV DNA Log10 1.34 (H) <0.00 log IU/mL    HBV DNA Comment  TSH   Result Value Ref Range    TSH 1.180 0.600 - 3.300 uIU/mL   T4, Free   Result Value Ref Range    Free T4 0.97 0.71 - 1.40 ng/dL   Hepatitis B e Antigen   Result Value Ref Range    Hep B E Ag Positive (A) Negative   Hepatitis B e Antibody   Result Value Ref Range    Hep B E Ab Negative Negative   Hepatitis B Core Antibody, Total   Result Value Ref Range    Hep B Core Total Ab Nonreactive Nonreactive   LYMPH MARKER LIMITED,FLOW   Result Value Ref Range    CD3% (T Cells) 67 61 - 86 %    Absolute CD3 Count 986 915-3,400 /uL    CD4% (T Helper) 19 (L) 34 - 58 %    Absolute CD4 Count 279 (L) 510-2,320 /uL    CD8% T Suppressor 46 (H) 12 - 38 %    Absolute CD8 Count 677 180-1,520 /uL    CD4:CD8 Ratio 0.4 (L) 0.9 - 4.8   CBC w/ Differential   Result Value Ref Range    WBC 5.7 4.5 - 11.0 10*9/L    RBC 5.48 4.50 - 5.90 10*12/L    HGB 15.0 13.5 - 17.5 g/dL    HCT 16.1 09.6 - 04.5 %    MCV 87.0 80.0 - 100.0 fL    MCH 27.4 26.0 - 34.0 pg    MCHC 31.5 31.0 - 37.0 g/dL    RDW 40.9 81.1 - 91.4 %    MPV 8.4 7.0 - 10.0 fL    Platelet 207 150 - 440 10*9/L    Neutrophils % 65.1 %    Lymphocytes % 23.7 %    Monocytes % 6.1 %    Eosinophils % 0.6 %    Basophils % 1.1 %    Absolute Neutrophils 3.7 2.0 - 7.5 10*9/L    Absolute Lymphocytes 1.3 (L) 1.5 - 5.0 10*9/L    Absolute Monocytes 0.4 0.2 - 0.8 10*9/L    Absolute Eosinophils 0.0 0.0 - 0.4 10*9/L    Absolute Basophils 0.1 0.0 - 0.1 10*9/L    Large Unstained Cells 3 0 - 4 %    Hypochromasia Slight (A) Not Present        Creatinine   Date Value Ref Range Status   11/09/2017 0.87 0.70 - 1.30 mg/dL Final   78/29/5621 3.08 0.70 - 1.30 mg/dL Final   65/78/4696 2.95 0.70 - 1.30 mg/dL Final   28/41/3244 0.10 0.80 - 1.30 mg/dL Final     Comment:     Interpretive Data: * This creatinine method is traceable to a GC-IDMS method and NIST standard reference material. (Entered 04/06/2011 17:09:22 By George Hugh       Triglycerides   Date Value Ref Range Status   08/06/2017 122 1 - 149 mg/dL Final     HDL   Date Value Ref Range Status   08/06/2017 49 40 - 59 mg/dL Final     LDL Calculated   Date Value Ref Range Status   08/06/2017 127 (H) 60 - 99 mg/dL Final     Comment:       NHLBI Recommended Ranges, LDL Cholesterol, for Adults (20+yrs) (ATPIII), mg/dL  Optimal              <272  Near Optimal        100-129  Borderline High     130-159  High  160-189  Very High            >=190  NHLBI Recommended Ranges, LDL Cholesterol, for Children (2-19 yrs), mg/dL  Desirable            <161  Borderline High     110-129  High >=130         IMAGING STUDIES:  None

## 2018-01-08 ENCOUNTER — Emergency Department: Payer: Managed Care, Other (non HMO)

## 2018-01-08 ENCOUNTER — Other Ambulatory Visit: Payer: Self-pay

## 2018-01-08 ENCOUNTER — Emergency Department
Admission: EM | Admit: 2018-01-08 | Discharge: 2018-01-08 | Disposition: A | Discharge status: Home / Self Care | Payer: Managed Care, Other (non HMO) | Attending: Emergency Medicine | Admitting: Emergency Medicine

## 2018-01-08 DIAGNOSIS — I10 Essential (primary) hypertension: Secondary | ICD-10-CM | POA: Diagnosis not present

## 2018-01-08 DIAGNOSIS — B20 Human immunodeficiency virus [HIV] disease: Secondary | ICD-10-CM | POA: Insufficient documentation

## 2018-01-08 DIAGNOSIS — R7303 Prediabetes: Secondary | ICD-10-CM | POA: Insufficient documentation

## 2018-01-08 DIAGNOSIS — R0789 Other chest pain: Secondary | ICD-10-CM | POA: Diagnosis present

## 2018-01-08 DIAGNOSIS — R0602 Shortness of breath: Secondary | ICD-10-CM | POA: Insufficient documentation

## 2018-01-08 DIAGNOSIS — Z79899 Other long term (current) drug therapy: Secondary | ICD-10-CM | POA: Insufficient documentation

## 2018-01-08 HISTORY — DX: Immunodeficiency, unspecified: D84.9

## 2018-01-08 HISTORY — DX: Essential (primary) hypertension: I10

## 2018-01-08 LAB — COMPREHENSIVE METABOLIC PANEL
ALT: 19 U/L (ref 0–44)
AST: 27 U/L (ref 15–41)
Albumin: 4.4 g/dL (ref 3.5–5.0)
Alkaline Phosphatase: 65 U/L (ref 38–126)
Anion gap: 7 (ref 5–15)
BUN: 12 mg/dL (ref 6–20)
CHLORIDE: 103 mmol/L (ref 98–111)
CO2: 31 mmol/L (ref 22–32)
CREATININE: 1.12 mg/dL (ref 0.61–1.24)
Calcium: 9.2 mg/dL (ref 8.9–10.3)
GFR calc Af Amer: >60 mL/min (ref >60)
GLUCOSE: 77 mg/dL (ref 70–99)
Potassium: 3.5 mmol/L (ref 3.5–5.1)
Sodium: 141 mmol/L (ref 135–145)
Total Bilirubin: 0.7 mg/dL (ref 0.3–1.2)
Total Protein: 8 g/dL (ref 6.5–8.1)

## 2018-01-08 LAB — TROPONIN I: Troponin I: <0.03 ng/mL (ref <0.03)

## 2018-01-08 LAB — CBC
HCT: 45.5 % (ref 40.0–52.0)
Hemoglobin: 15.3 g/dL (ref 13.0–18.0)
MCH: 28.2 pg (ref 26.0–34.0)
MCHC: 33.7 g/dL (ref 32.0–36.0)
MCV: 83.8 fL (ref 80.0–100.0)
Platelets: 220 10*3/uL (ref 150–440)
RBC: 5.42 MIL/uL (ref 4.40–5.90)
RDW: 13.1 % (ref 11.5–14.5)
WBC: 6.8 10*3/uL (ref 3.8–10.6)

## 2018-01-08 LAB — HIV RNA, QUANTITATIVE, PCR: HIV RNA QNT RSLT: DETECTED — AB

## 2018-01-08 LAB — HIV RNA: HIV 1 RNA:NCnc:Pt:Ser/Plas:Qn:Probe.amp.tar: <40 — ABNORMAL HIGH

## 2018-01-08 MED ORDER — FAMOTIDINE 20 MG PO TABS
20.0000 mg | ORAL_TABLET | Freq: Once | ORAL | Status: AC
Start: 2018-01-08 — End: 2018-01-08
  Administered 2018-01-08: 20 mg via ORAL
  Filled 2018-01-08: qty 1

## 2018-01-08 MED ORDER — CLONIDINE HCL 0.1 MG PO TABS
0.1000 mg | ORAL_TABLET | Freq: Once | ORAL | Status: AC
  Administered 2018-01-08: 0.1 mg via ORAL
  Filled 2018-01-08: qty 1

## 2018-01-08 MED ORDER — CLONIDINE HCL 0.1 MG PO TABS: 0.2000 mg | ORAL_TABLET | Freq: Once | ORAL | Status: DC

## 2018-01-08 NOTE — Discharge Instructions (Addendum)
Continue to take your blood pressure medication as prescribed.  We have provided a referral for you to follow-up with a cardiologist in the next 1 to 2 weeks.  You should also follow-up with your regular doctor.  Return to the ER for new, worsening, recurrent severe chest pain, tightness, difficulty breathing, weakness or lightheadedness, severe headaches, severely elevated blood pressures, or any other new or worsening symptoms that concern you.

## 2018-01-08 NOTE — ED Triage Notes (Signed)
Pt c/o chest pain that started at 7-8 am - the pain is  tightness/pressure that causes shortness of breath while walking - pt has had this previously and dx with heartburn the first time and the second time "internal bleeding" - denies N/V/D - reports lightheadedness

## 2018-01-08 NOTE — ED Provider Notes (Signed)
[facility] Emergency Department Provider Note ____________________________________________   First MD Initiated Contact with Patient 01/08/18 1600     (approximate)  I have reviewed the triage vital signs and the nursing notes.   HISTORY  Chief Complaint Chest Pain    HPI David Mccann is a 47 y.o. male PMH as noted below who presents with chest discomfort, acute onset approximately 8 hours ago, and coming in waves throughout the day.  He describes it as tightness.  He states he did have some exertional shortness of breath, but the chest pain itself was not exertional.  He also reports his blood pressure has been elevated today.  He denies any nausea or vomiting, cough, fever, or dark or bloody stools.  He states he had similar chest discomfort one time previously when he had "internal bleeding" from a tear in his stomach.  The patient states that he was recently started on hydrochlorothiazide for blood pressure, and took it today.  Past Medical History:  Diagnosis Date  . HIV infection (HCC)   . Hypertension   . Immune deficiency disorder [facility](HCC)     Patient Active Problem List   Diagnosis Date Noted  . Chronic hepatitis B (HCC) 10/11/2016  . HIV (human immunodeficiency virus infection) (HCC) 10/11/2016  . Hypertriglyceridemia 10/11/2016  . Pre-diabetes 10/11/2016    Past Surgical History:  Procedure Laterality Date  . LAPAROSCOPIC GASTRIC SLEEVE RESECTION      Prior to Admission medications   Medication Sig Start Date End Date Taking? Authorizing Provider  ciprofloxacin (CIPRO) 500 MG tablet Take 1 tablet (500 mg total) by mouth 2 (two) times daily. 04/09/17   Fisher, Roselyn BeringSusan W, PA-C  hydrochlorothiazide (HYDRODIURIL) 25 MG tablet Take by mouth. 11/09/17 12/09/17  [provider]  metroNIDAZOLE (FLAGYL) 500 MG tablet Take 1 tablet (500 mg total) by mouth 3 (three) times daily. 04/09/17   Fisher, Roselyn BeringSusan W, PA-C  PREZCOBIX 800-150 MG tablet  TAKE 1 TABLET BY MOUTH EVERY DAY. TAKE WITH FOOD 09/18/16   [provider]  Vitamin D, Ergocalciferol, (DRISDOL) 50000 units CAPS capsule Take by mouth.    [provider]    Allergies Patient has no known allergies.  Family History  Problem Relation Age of Onset  . Diabetes Mother   . Hypertension Mother   . Leukemia Father   . Hypertension Sister   . Hypertension Brother     Social History Social History   Tobacco Use  . Smoking status: Never Smoker  . Smokeless tobacco: Never Used  Substance Use Topics  . Alcohol use: Yes    Comment: occasionally  . Drug use: No    Review of Systems  Constitutional: No fever/chills Eyes: No visual changes. ENT: No sore throat. Cardiovascular: Positive for chest discomfort. Respiratory: Positive for shortness of breath. Gastrointestinal: No vomiting. Genitourinary: Negative for flank pain. Musculoskeletal: Positive for back pain. Skin: Negative for rash. Neurological: Negative for headache.   ____________________________________________   PHYSICAL EXAM:  VITAL SIGNS: ED Triage Vitals  Enc Vitals Group     BP 01/08/18 1130 (!) 161/110     Pulse Rate 01/08/18 1130 62     Resp 01/08/18 1130 16     Temp 01/08/18 1130 98.4 F (36.9 C)     Temp Source 01/08/18 1130 Oral     SpO2 01/08/18 1130 99 %     Weight 01/08/18 1127 199 lb (90.3 kg)     Height 01/08/18 1127 5\' 9"  (1.753 m)  Head Circumference --      Peak Flow --      Pain Score 01/08/18 1127 7     Pain Loc --      Pain Edu? --      Excl. in GC? --     Constitutional: Alert and oriented. Well appearing and in no acute distress. Eyes: Conjunctivae are normal.  Head: Atraumatic. Nose: No congestion/rhinnorhea. Mouth/Throat: Mucous membranes are moist.   Neck: Normal range of motion.  Cardiovascular: Normal rate, regular rhythm. Grossly normal heart sounds.  Good peripheral circulation. Respiratory: Normal respiratory effort.  No  retractions. Lungs CTAB. Gastrointestinal: No distention.  Musculoskeletal: No lower extremity edema.  Extremities warm and well perfused.  Neurologic:  Normal speech and language. No gross focal neurologic deficits are appreciated.  Skin:  Skin is warm and dry. No rash noted. Psychiatric: Mood and affect are normal. Speech and behavior are normal.  ____________________________________________   LABS (all labs ordered are listed, but only abnormal results are displayed)  Labs Reviewed  CBC  TROPONIN I  COMPREHENSIVE METABOLIC PANEL  TROPONIN I   ____________________________________________  EKG  ED ECG REPORT I, Dionne Bucy, the attending physician, personally viewed and interpreted this ECG.  Date: 01/08/2018 EKG Time: 1127 Rate: 67 Rhythm: normal sinus rhythm QRS Axis: normal Intervals: normal ST/T Wave abnormalities: Voltage criteria for LVH, otherwise normal Narrative Interpretation: no evidence of acute ischemia  ____________________________________________  RADIOLOGY  CXR: No focal infiltrate or other acute findings  ____________________________________________   PROCEDURES  Procedure(s) performed: No  Procedures  Critical Care performed: No ____________________________________________   INITIAL IMPRESSION / ASSESSMENT AND PLAN / ED COURSE  Pertinent labs & imaging results that were available during my care of the patient were reviewed by me and considered in my medical decision making (see chart for details).  47 year old male with history of hypertension, HIV, and other PMH as noted above presents with atypical chest pain over the course of the day today, with some associated shortness of breath.  On exam, the patient is well-appearing, the vital signs are normal except for hypertension, and the remainder of the exam is as described above.  EKG is nonischemic.  Overall I suspect most likely benign cause such as GERD, or musculoskeletal  pain.  Although patient does have hypertension, he has no other significant ACS risk factors and the pain is relatively atypical.  He also has no EKG changes and his first troponin is negative.  There is no clinical evidence of DVT or PE, or specific risk factors for this.  Although he does report some pain in his back, given his stable vital signs and well appearance, there is no evidence of aortic dissection or other vascular etiology.  There is also no evidence of PUD or other cause of acute upper GI bleed as the patient has no dark stools and no anemia.  Plan: Second troponin, symptomatic treatment with Pepcid, and I will give an additional dose of an antihypertensive.  Anticipate discharge home with cardiology referral if negative work-up.  ----------------------------------------- 6:06 PM on 01/08/2018 -----------------------------------------  The patient's blood pressure has normalized.  He states that he is completely asymptomatic at this time.  The repeat troponin is negative.  Given the negative work-up, stable vitals, and resolved symptoms, there is no indication for further ED work-up or admission.  I discussed the results of the work-up with the patient.  He feels comfortable to go home.  I will provide cardiology referral.  Return precautions given,  and he expresses understanding. ____________________________________________   FINAL CLINICAL IMPRESSION(S) / ED DIAGNOSES  Final diagnoses:  Atypical chest pain      NEW MEDICATIONS STARTED DURING THIS VISIT:  New Prescriptions   No medications on file     Note:  This document was prepared using Dragon voice recognition software and may include unintentional dictation errors.   Dionne Bucy, MD 01/08/18 1807

## 2018-01-09 LAB — HEPATITIS B DNA, ULTRAQUANTITATIVE, PCR: HBV DNA QUANT: NOT DETECTED

## 2018-01-09 LAB — HBV DNA COMMENT: Lab: 0

## 2018-01-10 MED FILL — BIKTARVY/50-200-25MG/TABS: BIKTARVY/50-200-25MG/TABS | 30 days supply | Qty: 30 | Fill #4

## 2018-01-10 NOTE — Unmapped (Signed)
[facility] Refill Coordination Note  Specialty Medication(s): Gary Campbell, DOB: [birth year]  Phone: [phone] (home) [phone] (work), Alternate phone contact: N/A  Phone or address changes today?: No  All above HIPAA information was verified with patient.  Shipping Address: [address]  [place] [address]   Insurance changes? No    Completed refill call assessment today to schedule patient's medication shipment from the [facility] [phone]).      Confirmed the medication and dosage are correct and have not changed: Yes, regimen is correct and unchanged.    Confirmed patient started or stopped the following medications in the past month:  No, there are no changes reported at this time.    Are you tolerating your medication?:  Gary Campbell reports tolerating the medication.    ADHERENCE    (Below is required for Medicare Part B or Transplant patients only - per drug):   How many tablets were dispensed last month: 30  Patient currently has 4 remaining.    Did you miss any doses in the past 4 weeks? No missed doses reported.    FINANCIAL/SHIPPING    Delivery Scheduled: Yes, Expected medication delivery date: 8/2 VIA [facility]     The patient will receive an FSI print out for each medication shipped and additional FDA Medication Guides as required.  Patient education from [facility] or Robet Leu may also be included in the shipment    Gary Campbell did not have any additional questions at this time.    Delivery address validated in FSI scheduling system: Yes, address listed in FSI is correct.    We will follow up with patient monthly for standard refill processing and delivery.      Thank you,  [name]   [facility] Specialty Technician

## 2018-01-29 MED ORDER — BICTEGRAVIR 50 MG-EMTRICITABINE 200 MG-TENOFOVIR ALAFENAM 25 MG TABLET
ORAL_TABLET | ORAL | 6 refills | 0 days | Status: CP
Start: 2018-01-29 — End: 2018-09-01
  Filled 2018-02-04: qty 30, 30d supply, fill #0

## 2018-01-29 NOTE — Unmapped (Signed)
[facility] Refill Coordination Note  Specialty Medication(s): BIKTARVY 50-200-25MG   Additional Medications shipped: Jeani Hawking, DOB: 1970/06/23  Phone: [phone] (home) [phone] (work), Alternate phone contact: N/A  Phone or address changes today?: No  All above HIPAA information was verified with patient.  Shipping Address: [address]  [place] [address]   Insurance changes? No    Completed refill call assessment today to schedule patient's medication shipment from the [facility] [phone]).      Confirmed the medication and dosage are correct and have not changed: Yes, regimen is correct and unchanged.    Confirmed patient started or stopped the following medications in the past month:  No, there are no changes reported at this time.    Are you tolerating your medication?:  [name] reports tolerating the medication.    ADHERENCE    (Below is required for Medicare Part B or Transplant patients only - per drug):   How many tablets were dispensed last month: 30   Patient currently has 10 TABS remaining.    Did you miss any doses in the past 4 weeks? No missed doses reported.    FINANCIAL/SHIPPING    Delivery Scheduled: Yes, Expected medication delivery date: 02/05/18     The patient will receive a drug information handout for each medication shipped and additional FDA Medication Guides as required.      [name] did not have any additional questions at this time.    Delivery address validated in Epic.    We will follow up with patient monthly for standard refill processing and delivery.      Thank you,  Unk Lightning   [facility] Specialty Technician

## 2018-01-30 NOTE — Unmapped (Signed)
Duration of Intervention: 5 Minutes    REASON FOR DISCHARGE: PT is engaged in care and has access to all medications    EFFECTIVE DATE:  01/30/18    OUTSIDE REFERRALS MADE: none needed at this time    Arlyn Dunning, MSW, LCSW

## 2018-02-04 MED FILL — BIKTARVY 50 MG-200 MG-25 MG TABLET: 30 days supply | Qty: 30 | Fill #0 | Status: AC

## 2018-02-08 ENCOUNTER — Ambulatory Visit: Admit: 2018-02-08 | Discharge: 2018-02-09 | Payer: PRIVATE HEALTH INSURANCE

## 2018-02-08 DIAGNOSIS — B181 Chronic viral hepatitis B without delta-agent: Secondary | ICD-10-CM

## 2018-02-08 DIAGNOSIS — B20 Human immunodeficiency virus [HIV] disease: Principal | ICD-10-CM

## 2018-02-18 MED ORDER — SODIUM,POTASSIUM,MAG SULFATES 17.5 GRAM-3.13 GRAM-1.6 GRAM ORAL SOLN
0 refills | 0 days | Status: CP
Start: 2018-02-18 — End: ?

## 2018-03-04 NOTE — Unmapped (Signed)
[facility] Refill and Clinical Coordination Note: HIV     HIV Medication(s): Biktarvy  Additional Medication(s): none    Gary Campbell, DOB: 01-06-1971  Phone: [phone] (home) , Alternate phone contact: N/A  Shipping address: [address]  [facility] [address]  Phone or address changes today?: No  All above HIPAA information verified.  Insurance changes? No    Completed refill and clinical call assessment today to schedule patient's medication shipment from the [facility] [phone]).      MEDICATION RECONCILIATION    Confirmed the medication and dosage are correct and have not changed: Yes, regimen is correct and unchanged.    Were there any changes to your medication(s) in the past month:  No, there are no changes reported at this time.    HIV-related labs:  Lab Results   Component Value Date/Time    HIVRS Detected (A) 01/07/2018 09:35 AM    HIVRS Not Detected 08/06/2017 10:33 AM    HIVCP <40 (H) 01/07/2018 09:35 AM    HIVCP 41 (H) 11/09/2017 02:25 PM    HIV10  01/07/2018 09:35 AM      Comment:      <1.6 log    HIV10 1.61 (H) 11/09/2017 02:25 PM    HIVCM  01/07/2018 09:35 AM      Comment:      HIV-1 quantification by real-time RT-PCR is performed using the Abbott RealTime  HIV-1 test. This test is FDA approved and can quantitate HIV-1 RNA over the  range of 40 - 1,000,000 copies/mL (1.60 log(10) -6.00 log(10) copies/mL). The reference range for this assay is Not Detected.    HIVCM  11/09/2017 02:25 PM      Comment:      HIV-1 quantification by real-time RT-PCR is performed using the Abbott RealTime  HIV-1 test. This test is FDA approved and can quantitate HIV-1 RNA over the  range of 40 - 1,000,000 copies/mL (1.60 log(10) -6.00 log(10) copies/mL). The reference range for this assay is Not Detected.    HIVCM  08/06/2017 10:33 AM      Comment:      HIV-1 quantification by real-time RT-PCR is performed using the Abbott RealTime  HIV-1 test. This test is FDA approved and can quantitate HIV-1 RNA over the  range of 40 - 1,000,000 copies/mL (1.60 log(10) -6.00 log(10) copies/mL). The reference range for this assay is Not Detected.    ACD4 316 (L) 01/07/2018 09:35 AM    ACD4 279 (L) 11/09/2017 02:25 PM    ACD4 325 (L) 08/06/2017 10:33 AM         ADHERENCE      Number of tablets of HIV Medication(s) at home: 4    Did you miss any doses in the past 4 weeks? No missed doses reported.  Adherence counseling provided? Not needed     SIDE EFFECT MANAGEMENT     Are you tolerating your medication?:  Gary Campbell reports tolerating the medication.  Side effect management discussed: None      Therapy is appropriate and should be continued.    The patient will receive a drug information handout and additional FDA Medication Guides as required.    Evidence of clinical benefit: See Epic note from 01/07/18      FINANCIAL/SHIPPING    Delivery Scheduled: Yes, Expected medication delivery date: 03/06/18 via UPS   Additional medications refilled: No additional medications/refills needed at this time.    Gary Campbell did not have any additional questions  at this time.      Delivery address validated in Epic.    We will follow up with patient monthly for standard refill processing and delivery.      Thank you,  Roderic Palau   [facility] Specialty Pharmacist

## 2018-03-05 MED FILL — BIKTARVY 50 MG-200 MG-25 MG TABLET: ORAL | 30 days supply | Qty: 30 | Fill #1

## 2018-03-05 MED FILL — BIKTARVY 50 MG-200 MG-25 MG TABLET: 30 days supply | Qty: 30 | Fill #1 | Status: AC

## 2018-03-21 ENCOUNTER — Ambulatory Visit: Payer: Self-pay | Admitting: Emergency Medicine

## 2018-03-21 VITALS — BP 198/89 | HR 55 | Temp 98.2°F | Resp 12

## 2018-03-21 DIAGNOSIS — Z4802 Encounter for removal of sutures: Secondary | ICD-10-CM
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
## 2018-03-26 NOTE — Unmapped (Signed)
[facility] Refill Coordination Note  Specialty Medication(s): Gary Campbell, DOB: 1970-07-21  Phone: [phone] (home) , Alternate phone contact: N/A  Phone or address changes today?: No  All above HIPAA information was verified with patient.  Shipping Address: [address]  [place] [address]   Insurance changes? No    Completed refill call assessment today to schedule patient's medication shipment from the [facility] [phone]).      Confirmed the medication and dosage are correct and have not changed: Yes, regimen is correct and unchanged.    Confirmed patient started or stopped the following medications in the past month:  No, there are no changes reported at this time.    Are you tolerating your medication?:  Gary Campbell reports tolerating the medication.    ADHERENCE      Did you miss any doses in the past 4 weeks? No missed doses reported.    FINANCIAL/SHIPPING    Delivery Scheduled: Yes, Expected medication delivery date: 10/22 VIA [facility]     The patient will receive a drug information handout for each medication shipped and additional FDA Medication Guides as required.      Gary Campbell did not have any additional questions at this time.    Delivery address validated in Epic.    We will follow up with patient monthly for standard refill processing and delivery.      Thank you,  [name]   [facility] Shared [facility] Specialty Technician

## 2018-04-01 MED FILL — BIKTARVY 50 MG-200 MG-25 MG TABLET: ORAL | 30 days supply | Qty: 30 | Fill #2

## 2018-04-01 MED FILL — BIKTARVY 50 MG-200 MG-25 MG TABLET: 30 days supply | Qty: 30 | Fill #2 | Status: AC

## 2018-04-30 NOTE — Unmapped (Signed)
[facility] Pharm[facility] Refill Coordination Note  Specialty Medication(s): Gary Campbell, DOB: [birth year]  Phone: [phone] (home) , Alternate phone contact: N/A  Phone or address changes today?: No  All above HIPAA information was verified with patient.  Shipping Address: [address]  [place] [address]   Insurance changes? No    Completed refill call assessment today to schedule patient's medication shipment from the [facility] [phone]).      Confirmed the medication and dosage are correct and have not changed: Yes, regimen is correct and unchanged.    Confirmed patient started or stopped the following medications in the past month:  No, there are no changes reported at this time.    Are you tolerating your medication?:  Gary Campbell reports tolerating the medication.    ADHERENCE        Did you miss any doses in the past 4 weeks? No missed doses reported.    FINANCIAL/SHIPPING    Delivery Scheduled: Yes, Expected medication delivery date: 11/22     Medication will be delivered via Next Day Courier to the home address in [facility].    The patient will receive a drug information handout for each medication shipped and additional FDA Medication Guides as required.      Gary Campbell did not have any additional questions at this time.    We will follow up with patient monthly for standard refill processing and delivery.      Thank you,  [name]   [facility] Shared [facility] Specialty Technician

## 2018-05-02 MED FILL — BIKTARVY 50 MG-200 MG-25 MG TABLET: ORAL | 30 days supply | Qty: 30 | Fill #3

## 2018-05-02 MED FILL — BIKTARVY 50 MG-200 MG-25 MG TABLET: 30 days supply | Qty: 30 | Fill #3 | Status: AC

## 2018-05-24 NOTE — Unmapped (Signed)
[facility] Pharm[facility] Refill Coordination Note  Specialty Medication(s): Biktarvy 50-200-25mg   Additional Medications shipped: N/A    Gary Campbell, DOB: [birth year]  Phone: [phone] (home) , Alternate phone contact: N/A  Phone or address changes today?: No  All above HIPAA information was verified with patient.  Shipping Address: [address]  [place] [address]   Insurance changes? No    Completed refill call assessment today to schedule patient's medication shipment from the [facility] [phone]).      Confirmed the medication and dosage are correct and have not changed: Yes, regimen is correct and unchanged.    Confirmed patient started or stopped the following medications in the past month:  No, there are no changes reported at this time.    Are you tolerating your medication?:  Gary Campbell reports tolerating the medication.    ADHERENCE    (Below is required for Medicare Part B or Transplant patients only - per drug):   How many tablets were dispensed last month: 30  Patient currently has 10 remaining.    Did you miss any doses in the past 4 weeks? No missed doses reported.    FINANCIAL/SHIPPING    Delivery Scheduled: Yes, Expected medication delivery date: 05/30/18     Medication will be delivered via Next Day Courier to the home address in [facility].    The patient will receive a drug information handout for each medication shipped and additional FDA Medication Guides as required.      Gary Campbell did not have any additional questions at this time.    We will follow up with patient monthly for standard refill processing and delivery.      Thank you,  [name]   [facility] Shared [facility] Specialty Technician

## 2018-05-29 MED FILL — BIKTARVY 50 MG-200 MG-25 MG TABLET: 30 days supply | Qty: 30 | Fill #4 | Status: AC

## 2018-05-29 MED FILL — BIKTARVY 50 MG-200 MG-25 MG TABLET: ORAL | 30 days supply | Qty: 30 | Fill #4

## 2018-06-24 NOTE — Unmapped (Signed)
[facility] Special[facility] Pharmacy Refill Coordination Note  Specialty Medication(s): Caron Presume, DOB: 1971-04-10  Phone: [phone] (home) , Alternate phone contact: N/A  Phone or address changes today?: No  All above HIPAA information was verified with patient.  Shipping Address: [address]  [place] [address]   Insurance changes? No    Completed refill call assessment today to schedule patient's medication shipment from the [facility] [phone]).      Confirmed the medication and dosage are correct and have not changed: Yes, regimen is correct and unchanged.    Confirmed patient started or stopped the following medications in the past month:  No, there are no changes reported at this time.    Are you tolerating your medication?:  [name] reports tolerating the medication.    ADHERENCE    (Below is required for Medicare Part B or Transplant patients only - per drug):   How many tablets were dispensed last month: 30  Patient currently has 7 remaining.    Did you miss any doses in the past 4 weeks? No missed doses reported.    FINANCIAL/SHIPPING    Delivery Scheduled: Yes, Expected medication delivery date: 06/28/2018     Medication will be delivered via Next Day Courier to the home address in [facility].    The patient will receive a drug information handout for each medication shipped and additional FDA Medication Guides as required.      [name] did not have any additional questions at this time.    We will follow up with patient monthly for standard refill processing and delivery.      Thank you,  [name]   [facility] Shared [facility] Specialty Technician

## 2018-07-02 MED FILL — BIKTARVY 50 MG-200 MG-25 MG TABLET: ORAL | 30 days supply | Qty: 30 | Fill #5

## 2018-07-02 MED FILL — BIKTARVY 50 MG-200 MG-25 MG TABLET: 30 days supply | Qty: 30 | Fill #5 | Status: AC

## 2018-07-02 NOTE — Unmapped (Signed)
Gary Campbell 's Biktarvy shipment will be delayed due to Cost Increase We have contacted the patient and communicated the delivery change to patient/caregiver We will reschedule the medication for the delivery date that the patient agreed upon. We have confirmed the delivery date as 01/22 .

## 2018-07-08 ENCOUNTER — Ambulatory Visit
Admit: 2018-07-08 | Discharge: 2018-07-09 | Payer: PRIVATE HEALTH INSURANCE | Attending: Infectious Disease | Primary: Infectious Disease

## 2018-07-08 DIAGNOSIS — B20 Human immunodeficiency virus [HIV] disease: Principal | ICD-10-CM

## 2018-07-08 DIAGNOSIS — B181 Chronic viral hepatitis B without delta-agent: Secondary | ICD-10-CM

## 2018-07-08 LAB — CBC W/ AUTO DIFF
BASOPHILS ABSOLUTE COUNT: 0.1 10*9/L (ref 0.0–0.1)
BASOPHILS RELATIVE PERCENT: 0.9 %
EOSINOPHILS ABSOLUTE COUNT: 0.1 10*9/L (ref 0.0–0.4)
HEMATOCRIT: 49.2 % (ref 41.0–53.0)
HEMOGLOBIN: 15.8 g/dL (ref 13.5–17.5)
LARGE UNSTAINED CELLS: 3 % (ref 0–4)
LYMPHOCYTES ABSOLUTE COUNT: 1.9 10*9/L (ref 1.5–5.0)
LYMPHOCYTES RELATIVE PERCENT: 36.4 %
MEAN CORPUSCULAR HEMOGLOBIN: 27.8 pg (ref 26.0–34.0)
MEAN CORPUSCULAR VOLUME: 86.9 fL (ref 80.0–100.0)
MEAN PLATELET VOLUME: 7.9 fL (ref 7.0–10.0)
MONOCYTES ABSOLUTE COUNT: 0.3 10*9/L (ref 0.2–0.8)
MONOCYTES RELATIVE PERCENT: 6.4 %
NEUTROPHILS ABSOLUTE COUNT: 2.7 10*9/L (ref 2.0–7.5)
NEUTROPHILS RELATIVE PERCENT: 51.8 %
PLATELET COUNT: 259 10*9/L (ref 150–440)
RED BLOOD CELL COUNT: 5.67 10*12/L (ref 4.50–5.90)
RED CELL DISTRIBUTION WIDTH: 13.2 % (ref 12.0–15.0)
WBC ADJUSTED: 5.3 10*9/L (ref 4.5–11.0)

## 2018-07-08 LAB — LYMPH MARKER LIMITED,FLOW
ABSOLUTE CD4 CNT: 432 {cells}/uL — ABNORMAL LOW (ref 510–2320)
ABSOLUTE CD8 CNT: 988 {cells}/uL (ref 180–1520)
CD4% (T HELPER)": 21 % — ABNORMAL LOW (ref 34–58)

## 2018-07-08 LAB — COMPREHENSIVE METABOLIC PANEL
ALBUMIN: 4.7 g/dL (ref 3.5–5.0)
ALKALINE PHOSPHATASE: 63 U/L (ref 38–126)
ALT (SGPT): 24 U/L (ref <50)
ANION GAP: 11 mmol/L (ref 7–15)
AST (SGOT): 33 U/L (ref 19–55)
BILIRUBIN TOTAL: 0.6 mg/dL (ref 0.0–1.2)
BLOOD UREA NITROGEN: 17 mg/dL (ref 7–21)
BUN / CREAT RATIO: 16
CALCIUM: 9.6 mg/dL (ref 8.5–10.2)
CHLORIDE: 96 mmol/L — ABNORMAL LOW (ref 98–107)
CO2: 33 mmol/L — ABNORMAL HIGH (ref 22.0–30.0)
CREATININE: 1.06 mg/dL (ref 0.70–1.30)
EGFR CKD-EPI NON-AA MALE: 82 mL/min/{1.73_m2} (ref >=60)
GLUCOSE RANDOM: 92 mg/dL (ref 70–179)
POTASSIUM: 4.1 mmol/L (ref 3.5–5.0)
PROTEIN TOTAL: 8.5 g/dL — ABNORMAL HIGH (ref 6.5–8.3)
SODIUM: 140 mmol/L (ref 135–145)

## 2018-07-08 LAB — LIPID PANEL
CHOLESTEROL/HDL RATIO SCREEN: 3.8 (ref <5.0)
CHOLESTEROL: 210 mg/dL — ABNORMAL HIGH (ref 100–199)
HDL CHOLESTEROL: 55 mg/dL (ref 40–59)
LDL CHOLESTEROL CALCULATED: 131 mg/dL — ABNORMAL HIGH (ref 60–99)
NON-HDL CHOLESTEROL: 155 mg/dL
VLDL CHOLESTEROL CAL: 23.8 mg/dL (ref 11–50)

## 2018-07-08 LAB — ABSOLUTE CD4 CNT: Cells.CD3+CD4+:NCnc:Pt:XXX:Qn:: 432 — ABNORMAL LOW

## 2018-07-08 LAB — CHOLESTEROL/HDL RATIO SCREEN: Lab: 3.8

## 2018-07-08 LAB — RED BLOOD CELL COUNT: Lab: 5.67

## 2018-07-08 LAB — ALKALINE PHOSPHATASE: Alkaline phosphatase:CCnc:Pt:Ser/Plas:Qn:: 63

## 2018-07-08 MED ORDER — SILDENAFIL 100 MG TABLET
ORAL_TABLET | Freq: Once | ORAL | 6 refills | 0 days | Status: CP | PRN
Start: 2018-07-08 — End: 2018-11-19

## 2018-07-08 NOTE — Unmapped (Signed)
It was nice to see you today!    Provider today:  Andres Ege, FNP-BC  Clinic nurse:  Carley Hammed, RN    Contact information:    ?? The ID clinic phone number is [phone]   ?? The ID clinic fax number is [phone]  ?? For urgent issues on nights and weekends: Call the ID Physician on-call through the [facility] Operator at [phone].    Please sign up for My [place] Chart - This is a great way to review your labs and track your appointments    Please try to arrive 30 minutes BEFORE your scheduled appointment time!  This will give you time to fill out any front desk paperwork needed for your visit, and allow you to be seen as close to your scheduled appointment time as possible.    ID Urgent Care Clinic:  Please call ahead to speak with the nursing staff for an assigned time to come.  Please arrive at least 15 minutes before your assigned time for check in.    Medication Refills:  ?? Contact your pharmacy and ask them to electronically send or fax the request to the clinic.  ?? Please bring all medications in original bottles to every appointment.      [facility] [place] Eligibility and HMAP (formerly called ADAP)  ?? Please remember to renew your application twice a year during the periods: January-February15th and July-August 15th.   ?? Failure to renew your application will result in an ability to receive services or medications.    The following items are needed for each renewal period:   ?? [employer] Identification Card or Information systems manager (if you don't have one, then a bill with your name and address in [place])   ?? Proof of income (award letter, [employer]-2, or last two check stubs) -  household income required  ?? Proof of health insurance is required if you have it    If you are unable to come in for renewal, call the ID clinic main number and connect to a benefits counselor.  We can mail, fax or e-mail it to you.     [facility] phone number: [phone].     Social Work Nurse, adult  We have licensed clinical social workers on staff who may help with transportation, mental health and substance use needs, provide referrals for housing, food or legal needs and provide other resources as needed for eligible clients.      Financial/Benefits Services  The clinic has several benefits counselors who can help you understand your financial eligibility and benefits, complete applications to obtain medications, and answer questions about health care costs, bills, and insurance.    Pharmacist Services  A pharmacist is on staff in the clinic who can help with strategies to take medication regularly, answer questions on drug interactions or other medication questions.    Nutrition Services  Counseling from a registered dietician is available for Kayla Deshaies eligible clients    Please ask to speak with any of these support personnel at your next visit or call the main clinic number and ask to speak with them whenever needed.  We are here to help!

## 2018-07-08 NOTE — Unmapped (Signed)
PCP:  Electa Sniff, MD    07/08/2018    This is a RETURN visit for this 48 y.o. male with the following diagnoses:    Patient Active Problem List    Diagnosis Date Noted   ??? HIV disease (CMS-HCC) 08/06/2017   ??? HBV (hepatitis B virus) infection 08/06/2017       ASSESSMENT/PLAN:    HIV  ?? Dx'd in 1989.   ?? Was cared for here until ~2003. Most recently cared for at [facility] by Matilde Haymaker  ?? Was on Viread+Tivicay+Prezcobix when switched over from [facility] to [facility]  ?? On transfer had HIV RNA <20c/mL  ?? Has HBV co-infection  ?? Documented K103N and 184V  ?? I changed him to [facility] which he started May 2019  ?? HIV RNA <40 c/mL in July 2019  ?? Says missed only 3 doses since July 2019 to now  ?? Wt has gone up since switch by 10 lbs. He thinks may be diet and exercise related but I am concerned could be addition of TAF.   ?? PLAN:  ?? Recheck labs today  ?? If elevated will look at getting Archive DNA if low level viremia occurs  ?? Watch weight. He was counseled re potential med effects and he will try to diet to see if this has impact     HTN  ?? Better on HCTZ 25 mg daily  ?? Some ED since starting (lost am erections)  ?? Does not want to change his BP med  ?? 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 On Biktarvy which contains TAF and FTC   ?? HBV DAN repeatedly undetectable   ?? Needs q6 month ultrasounds. Last was in July 2019 and was neg for [facility]. Will order another.  ?? Recheck labs    Insomnia  ?? Good sleep hygiene. Only 1 cup am coffee and no other caffeine   ?? Started before Tivicay and after partner's death  ?? Issue is falling asleep  ?? Melatonin OTC did not work  ?? Sleeps ~4 hours per night. Trouble falling asleep.  ?? BUT, says he feels rested in am.   ?? He may be someone who needs relatively little sleep.     Mental Health  ?? Stable and good    Health Maintenance   ?? Immunizations: Had flu shot. Get records from [facility]  ?? Cancer Screening: Family hx of early colon cancer. Has had colonoscopy and due for repeat. US Liver.   ?? Lipids: Not horrible but lowish HDL. AASCVD 10y risk is 9.6%. Will re-check on Biktarvy  ?? Other:   ?? Had gastric sleeve 3 years ago. Has kept wt off. Not on PPIs.  ?? Follow BP  ?? No meds other than ART and HCTZ  ?? Has 3/6 systolic murmur - known and has had echocardiogram he said without concerns.     Will follow up in 6 months.       Chief Complaint: HIV follow-up    HPI:  [name] returns for an appointment.       ROS:   No fever chills, sweats, nausea, vomiting, diarrhea, rash, joint pain. Pos for  insomnia (chronic)    All other systems are negative.    ALLERGIES:  Patient has no known allergies.    MEDICATIONS:  Current Outpatient Medications   Medication Sig Dispense Refill   ??? bictegrav-emtricit-tenofov ala (BIKTARVY) 50-200-25  mg tablet TAKE 1 TABLET BY MOUTH ONCE DAILY 30 tablet 6   ??? hydroCHLOROthiazide (HYDRODIURIL) 25 MG tablet Take 1 tablet (25 mg total) by mouth daily. 90 tablet 10   ??? ibuprofen (ADVIL,MOTRIN) 200 MG tablet Take 400 mg by mouth every six (6) hours as needed for pain.     ??? sildenafil (VIAGRA) 100 MG tablet Take 1 tablet (100 mg total) by mouth once as needed for erectile dysfunction for up to 1 dose. 1 tablet 6   ??? sodium,potassium,mag sulfates (SUPREP BOWEL PREP KIT) 17.5-3.13-1.6 gram SolR Take as directed instructions mailed to patient 1 Bottle 0     No current facility-administered medications for this visit.        PHYSICAL EXAM:  Vitals:    07/08/18 0821   BP: 135/86   Pulse: 59   Temp: 36.8 ??C (98.2 ??F)     CONSTITUTIONAL: Alert,well appearing, no distress  HEENT: Moist mucous membranes, oropharynx clear without erythema or exudate  NECK: Supple, no lymphadenopathy  CARDIOVASCULAR: Regular, 3/6 SEM.   PULM: Clear to auscultation bilaterally  GASTROINTESTINAL: Soft, active bowel sounds, nontender  EXTREMITIES: No lower extremity edema bilaterally, dorsalis pedis pulses 2+ bilaterally   SKIN: No rashes or lesions NEUROLOGIC: No focal motor or sensory deficits    DATA:    Results for orders placed or performed in visit on 01/07/18   HIV RNA, Quantitative, PCR   Result Value Ref Range    HIV RNA Quant Result Detected (A) Not Detected    HIV RNA <40 (H) <0 copies/mL    HIV RNA Log(10)  <0.00 log copies/mL    HIV RNA Comment     Comprehensive Metabolic Panel   Result Value Ref Range    Sodium 138 135 - 145 mmol/L    Potassium 3.8 3.5 - 5.0 mmol/L    Chloride 98 98 - 107 mmol/L    CO2 35.0 (H) 22.0 - 30.0 mmol/L    Anion Gap 5 (L) 9 - 15 mmol/L    BUN 13 7 - 21 mg/dL    Creatinine 0.98 1.19 - 1.30 mg/dL    BUN/Creatinine Ratio 13     EGFR CKD-EPI Non-African American, Male 20 >=60 mL/min/1.68m2    EGFR CKD-EPI African American, Male >90 >=60 mL/min/1.87m2    Glucose 94 65 - 179 mg/dL    Calcium 9.5 8.5 - 14.7 mg/dL    Albumin 4.3 3.5 - 5.0 g/dL    Total Protein 7.6 6.5 - 8.3 g/dL    Total Bilirubin 0.6 0.0 - 1.2 mg/dL    AST 28 19 - 55 U/L    ALT 21 19 - 72 U/L    Alkaline Phosphatase 65 38 - 126 U/L   Hepatitis B DNA, PCR, Quantitative   Result Value Ref Range    HBV DNA Quant Not Detected Not Detected    HBV DNA Comment     Hepatitis B Surface Antigen   Result Value Ref Range    Hep B Surface Ag (A) Nonreactive     For final results see Hepatitis B Surface Antigen Neutralization   LYMPH MARKER LIMITED,FLOW   Result Value Ref Range    CD3% (T Cells) 67 61 - 86 %    Absolute CD3 Count 1,244 915-3,400 /uL    CD4% (T Helper) 17 (L) 34 - 58 %    Absolute CD4 Count 316 (L) 510-2,320 /uL    CD8% T Suppressor 49 (H) 12 - 38 %  Absolute CD8 Count 909 180-1,520 /uL    CD4:CD8 Ratio 0.3 (L) 0.9 - 4.8   HEP.B NEUTRALIZATION   Result Value Ref Range    Hep B S Ag Neutralization (A) Reactive by screening assay;non-confirmable by neutralization assay     Hepatitis B Surface Antigen POSITIVE;confirmed by neutralization   CBC w/ Differential   Result Value Ref Range    WBC 5.2 4.5 - 11.0 10*9/L    RBC 5.27 4.50 - 5.90 10*12/L    HGB 14.7 13.5 - 17.5 g/dL    HCT 29.5 62.1 - 30.8 %    MCV 85.4 80.0 - 100.0 fL    MCH 27.8 26.0 - 34.0 pg    MCHC 32.6 31.0 - 37.0 g/dL    RDW 65.7 84.6 - 96.2 %    MPV 7.5 7.0 - 10.0 fL    Platelet 233 150 - 440 10*9/L    Neutrophils % 53.6 %    Lymphocytes % 32.6 %    Monocytes % 8.5 %    Eosinophils % 1.0 %    Basophils % 0.9 %    Absolute Neutrophils 2.8 2.0 - 7.5 10*9/L    Absolute Lymphocytes 1.7 1.5 - 5.0 10*9/L    Absolute Monocytes 0.4 0.2 - 0.8 10*9/L    Absolute Eosinophils 0.1 0.0 - 0.4 10*9/L    Absolute Basophils 0.1 0.0 - 0.1 10*9/L    Large Unstained Cells 3 0 - 4 %        Creatinine   Date Value Ref Range Status   01/07/2018 1.00 0.70 - 1.30 mg/dL Final   95/28/4132 4.40 0.70 - 1.30 mg/dL Final   04/08/2535 6.44 0.70 - 1.30 mg/dL Final   03/47/4259 5.63 0.70 - 1.30 mg/dL Final   87/56/4332 9.51 0.80 - 1.30 mg/dL Final     Comment:     Interpretive Data: * This creatinine method is traceable to a GC-IDMS method and NIST standard reference material. (Entered 04/06/2011 17:09:22 By George Hugh       Triglycerides   Date Value Ref Range Status   08/06/2017 122 1 - 149 mg/dL Final     HDL   Date Value Ref Range Status   08/06/2017 49 40 - 59 mg/dL Final     LDL Calculated   Date Value Ref Range Status   08/06/2017 127 (H) 60 - 99 mg/dL Final     Comment:       NHLBI Recommended Ranges, LDL Cholesterol, for Adults (20+yrs) (ATPIII), mg/dL  Optimal              <884  Near Optimal        100-129  Borderline High     130-159  High                160-189  Very High            >=190  NHLBI Recommended Ranges, LDL Cholesterol, for Children (2-19 yrs), mg/dL  Desirable            <166  Borderline High     110-129  High                 >=130         IMAGING STUDIES:  None

## 2018-07-11 LAB — HIV RNA, QUANTITATIVE, PCR

## 2018-07-11 LAB — HIV RNA LOG(10): Lab: 0

## 2018-07-12 LAB — HBV DNA COMMENT: Lab: 0

## 2018-07-12 NOTE — Unmapped (Signed)
Addended by: Melene Muller on: 07/12/2018 02:42 PM     Modules accepted: Orders

## 2018-07-12 NOTE — Unmapped (Signed)
Addended by: Melene Muller on: 07/12/2018 02:20 PM     Modules accepted: Orders

## 2018-07-12 NOTE — Unmapped (Signed)
Addended by: Melene Muller on: 07/12/2018 02:23 PM     Modules accepted: Orders

## 2018-07-23 NOTE — Unmapped (Signed)
[facility] Refill and Clinical Coordination Note: HIV     HIV Medication(s): Biktarvy  Additional Medication(s): none    Gary Campbell, DOB: [birth year]  Phone: [phone] (home) , Alternate phone contact: N/A  Shipping address: [address]  [facility] [place] [address]  Phone or address changes today?: No  All above HIPAA information verified.  Insurance changes? No    Completed refill and clinical call assessment today to schedule patient's medication shipment from the [facility] [phone]).      MEDICATION RECONCILIATION    Confirmed the medication and dosage are correct and have not changed: Yes, regimen is correct and unchanged.    Were there any changes to your medication(s) in the past month:  No, there are no changes reported at this time.    HIV-related labs:  Lab Results   Component Value Date/Time    HIVRS Detected (A) 07/08/2018 09:18 AM    HIVRS Detected (A) 01/07/2018 09:35 AM    HIVRS Not Detected 08/06/2017 10:33 AM    HIVCP <40 (H) 07/08/2018 09:18 AM    HIVCP <40 (H) 01/07/2018 09:35 AM    HIVCP 41 (H) 11/09/2017 02:25 PM    HIV10  07/08/2018 09:18 AM      Comment:      <1.6 log    HIV10  01/07/2018 09:35 AM      Comment:      <1.6 log    HIV10 1.61 (H) 11/09/2017 02:25 PM    HIVCM  07/08/2018 09:18 AM      Comment:      HIV-1 quantification by real-time RT-PCR is performed using the Abbott RealTime  HIV-1 test. This test is FDA approved and can quantitate HIV-1 RNA over the  range of 40 - 1,000,000 copies/mL (1.60 log(10) -6.00 log(10) copies/mL). The reference range for this assay is Not Detected.    HIVCM  01/07/2018 09:35 AM      Comment:      HIV-1 quantification by real-time RT-PCR is performed using the Abbott RealTime  HIV-1 test. This test is FDA approved and can quantitate HIV-1 RNA over the  range of 40 - 1,000,000 copies/mL (1.60 log(10) -6.00 log(10) copies/mL). The reference range for this assay is Not Detected.    HIVCM  11/09/2017 02:25 PM Comment:      HIV-1 quantification by real-time RT-PCR is performed using the Abbott RealTime  HIV-1 test. This test is FDA approved and can quantitate HIV-1 RNA over the  range of 40 - 1,000,000 copies/mL (1.60 log(10) -6.00 log(10) copies/mL). The reference range for this assay is Not Detected.    ACD4 432 (L) 07/08/2018 09:18 AM    ACD4 316 (L) 01/07/2018 09:35 AM    ACD4 279 (L) 11/09/2017 02:25 PM         ADHERENCE      Number of tablets of HIV Medication(s) at home: 5    Did you miss any doses in the past 4 weeks? No missed doses reported.  Adherence counseling provided? Not needed     SIDE EFFECT MANAGEMENT    Are you tolerating your medication?:  Gary Campbell reports tolerating the medication.  Side effect management discussed: None      Evidence of clinical benefit: See Epic note from 07/08/2018      Therapy is appropriate and should be continued.      FINANCIAL/SHIPPING    Delivery Scheduled: Yes, Expected medication delivery date: 07/24/2018     Medication will be  delivered via Next Day Courier to the home address in [place].    Additional medications refilled: No additional medications/refills needed at this time.    The patient will receive a drug information handout for each medication shipped and additional FDA Medication Guides as required.      Gary Campbell did not have any additional questions at this time.    Delivery address confirmed in Epic.     We will follow up with patient monthly for standard refill processing and delivery.      Thank you,  Roderic Palau   [facility] Shared [facility] Specialty Pharmacist

## 2018-07-23 NOTE — Unmapped (Signed)
Gary Campbell 's BIKTARVY shipment will be delayed due to Refill too soon until 07/25/18 We have contacted the patient and left a message We will call the patient to reschedule the delivery upon resolution. We have confirmed the delivery date as N/A .

## 2018-07-25 NOTE — Unmapped (Signed)
Gary Campbell 's Biktary shipment will be delayed due to patient is going out of town We have contacted the patient and communicated the delivery change to patient/caregiver We will reschedule the medication for the delivery date that the patient agreed upon. We have confirmed the delivery date as 2/17 .

## 2018-07-29 MED FILL — BIKTARVY 50 MG-200 MG-25 MG TABLET: ORAL | 30 days supply | Qty: 30 | Fill #6

## 2018-07-29 MED FILL — BIKTARVY 50 MG-200 MG-25 MG TABLET: 30 days supply | Qty: 30 | Fill #6 | Status: AC

## 2018-07-29 NOTE — Unmapped (Signed)
**  CORRECTION TO KAT'S NOTE: NEW DELIVERY DATE IS 2/18, SHIPPING 2/17 PER PATIENT**

## 2018-08-08 ENCOUNTER — Ambulatory Visit: Payer: Self-pay | Admitting: Adult Health

## 2018-08-08 ENCOUNTER — Other Ambulatory Visit: Payer: Self-pay

## 2018-08-08 VITALS — BP 140/88 | HR 65 | Temp 98.2°F | Resp 14

## 2018-08-08 DIAGNOSIS — B084 Enteroviral vesicular stomatitis with exanthem: Secondary | ICD-10-CM

## 2018-08-08 NOTE — Patient Instructions (Addendum)
Hand, Foot, and Mouth Disease, Adult  Hand, foot, and mouth disease is a common viral illness. It happens mainly in children who are younger than 50 years old, but adolescents and adults may also get it. The illness often causes:   Sore throat.   Sores in the mouth.   Fever.   Rash on the hands and feet.  Usually, this condition is not serious. Most people get better within 1-2 weeks.  What are the causes?  This condition is usually caused by a group of viruses called enteroviruses. The disease can spread from person to person (is contagious). A person is most contagious during the first week of the illness. The infection spreads through direct contact with:   Nose discharge of an infected person.   Throat discharge of an infected person.   Stool (feces) of an infected person.  What are the signs or symptoms?  Symptoms of this condition include:   Small sores in the mouth. These may cause pain.   A rash on the hands and feet, and sometimes on the buttocks. The rash may also occur on the arms, legs, or other areas of the body. The rash may look like small red bumps or sores and may have blisters.   Fever.   Body aches or headaches.   Irritability.   Decreased appetite.  How is this diagnosed?  This condition can usually be diagnosed with a physical exam in which your health care provider will look at your rash and mouth sores. Tests are usually not needed. In some cases, a stool (feces) sample or a throat swab may be taken to check for the virus or for other infections.  How is this treated?  In most cases, no treatment is needed. People usually get better within 2 weeks without treatment. To help relieve pain or fever, your health care provider may recommend over-the-counter medicines such as ibuprofen or acetaminophen. To help relieve discomfort from mouth sores, your health care provider may recommend using:   Solutions that are rinsed in the mouth.   Pain-relieving gel that is applied to the sores  (topical gel).   Antacid medicine.  Follow these instructions at home:  Managing pain and discomfort   Rinse your mouth with a salt-water mixture 3-4 times a day or as needed. To make a salt-water mixture, completely dissolve -1 tsp of salt in 1 cup of warm water. This can help to reduce pain from the mouth sores. Your health care provider may also recommend other rinse solutions to treat mouth sores.   To relieve discomfort when you are eating:  ? Try combinations of foods to see what you can tolerate. Aim for a balanced diet.  ? Eat soft foods. These may be easier to swallow.  ? Avoid foods and drinks that are salty, spicy, or acidic.  ? Avoid alcohol.  ? Try cold food and drinks, such as water, milk, milkshakes, frozen ice pops, slushies, and sherbets. Low-calorie sport drinks are good choices for staying hydrated.  General instructions   Return to your normal activities as told by your health care provider. Ask your health care provider what activities are safe for you.   Take or apply over-the-counter and prescription medicines only as told by your health care provider.   Wash your hands often with soap and water. If soap and water are not available, use hand sanitizer.   Stay away from work, schools, or other group settings during the first few days of the   illness, or until your fever is gone.   Keep all follow-up visits as told by your health care provider. This is important.  Contact a health care provider if:   Your symptoms get worse or do not improve within 2 weeks.   You have pain that does not get better with medicine.   You feel very irritable.   You have trouble swallowing.   You develop sores or blisters on your lips or outside of your mouth.   You have a fever for more than 3 days.  Get help right away if:   You develop signs of severe dehydration, such as:  ? Decreased urination. This means urinating only very small amounts or urinating fewer than 3 times in a 24-hour  period.  ? Urine that is very dark.  ? Dry mouth, tongue, or lips.  ? Decreased tears or sunken eyes.  ? Dry skin.  ? Rapid breathing.  ? Decreased activity or being very sleepy.  ? Pale skin.  ? Fingertips taking longer than 2 seconds to turn pink after a gentle squeeze.  ? Weight loss.   You have a severe headache.   You have a stiff neck.   You experience changes in your behavior.   You have chest pain.   You have trouble breathing.  Summary   Hand, foot, and mouth disease is a common viral illness.   This disease can spread from person to person (is contagious).   The illness often causes a sore throat, sores in the mouth, fever, and a rash on the hands and feet.   Typically, no treatment is needed for this condition. People usually get better within 2 weeks without treatment.   Get help right away if you develop signs of severe dehydration.  This information is not intended to replace advice given to you by your health care provider. Make sure you discuss any questions you have with your health care provider.  Document Released: 07/17/2016 Document Revised: 07/17/2016 Document Reviewed: 07/17/2016  Elsevier Interactive Patient Education  2019 [employer]

## 2018-08-08 NOTE — Progress Notes (Signed)
[facility]  Subjective:     Patient ID: David Mccann, male   DOB: [birth year], 48 y.o.   MRN: [id number]   Blood pressure 140/88, pulse 65, temperature 98.2 F (36.8 C), temperature source Oral, resp. rate 14, SpO2 99 %.  Patient is a 48 year old male in no acute distress who comes to the clinic with chief complaint of  dry peeling skin near cuticle x 2 weeks.  He denies any pain currently and reports that he has been using Eucerin lotion which seems to be improving the dry skin.  He does report that he did file the dry skin around his cuticles with over the last couple days. Denies any pain.   He did have an episode 1 week ago where he had a sore throat with swallowing that lasted a few days.Mild fatigue at that time that resolved since.   He has been exposed to hand foot and mouth by three siblings with this in a home. He picks up these three  Children in his car and transfers to another home.   Patient  denies any fever, body aches,chills,chest pain, shortness of breath, nausea, vomiting, or diarrhea.  He is immunocompromised, he has regular follow-ups with his infectious disease doctor as HIV positive.  He denies any new sexual partners or concerns for sexually transmitted diseases.  Denies any exposures.  Denies any other rash or lesions on his body.  Denies any trouble swallowing.  He is able to swallow liquids and food. He denies any new medications or antibiotics, he denies any use of NSAIDs.  Other recent illnesses or exposures. Patient Active Problem List   Diagnosis Date Noted  . HBV (hepatitis B virus) infection 10/11/2016  . Human immunodeficiency virus (HIV) disease (HCC) 10/11/2016  . Hypertriglyceridemia 10/11/2016  . Pre-diabetes 10/11/2016  . Internal hemorrhoids 03/29/2012    Current Outpatient Medications:  .  bictegravir-emtricitabine-tenofovir AF (BIKTARVY) 50-200-25 MG TABS tablet, Take by mouth., Disp: , Rfl:  .   Na Sulfate-K Sulfate-Mg Sulf 17.5-3.13-1.6 GM/177ML SOLN, Take as directed instructions mailed to patient, Disp: , Rfl:  .  sildenafil (VIAGRA) 100 MG tablet, Take by mouth., Disp: , Rfl:  .  cyclobenzaprine (FLEXERIL) 10 MG tablet, , Disp: , Rfl:  .  hydrochlorothiazide (HYDRODIURIL) 25 MG tablet, Take by mouth., Disp: , Rfl:  .  methylPREDNISolone (MEDROL DOSEPAK) 4 MG TBPK tablet, , Disp: , Rfl:   Rash  This is a new problem. The current episode started 1 to 4 weeks ago (2 weeks ). The problem has been gradually improving since onset. The affected locations include the right fingers and left fingers. The rash is characterized by peeling (he does report he noticed a few small blisters that first occured and then now has skin peeling fingertips/ around cuticles only. ). Associated with: exposure to Hand foot mouth  Associated symptoms include a sore throat (mild on left side - resolving. ). Pertinent negatives include no anorexia, congestion, cough, diarrhea, eye pain, facial edema, fatigue, fever, joint pain, nail changes, rhinorrhea, shortness of breath or vomiting. Past treatments include moisturizer. The treatment provided moderate relief. There is no history of allergies, asthma, eczema or varicella. (HIV positive. )        Review of Systems  Constitutional: Negative for fatigue and fever.  HENT: Positive for sore throat (mild on left side - resolving. ). Negative for congestion and rhinorrhea.   Eyes: Negative for pain.  Respiratory: Negative for cough and  shortness of breath.   Gastrointestinal: Negative for anorexia, diarrhea and vomiting.  Musculoskeletal: Negative for joint pain.  Skin: Positive for rash. Negative for nail changes.       Objective:   Physical Exam Vitals signs reviewed.  Constitutional:      General: He is not in acute distress.    Appearance: Normal appearance. He is not ill-appearing, toxic-appearing or diaphoretic.     Comments: Patient is alert and  oriented and responsive to questions Engages in eye contact with provider. Speaks in full sentences without any pauses without any shortness of breath or distress.    HENT:     Head: Normocephalic and atraumatic.     Salivary Glands: Right salivary gland is not diffusely enlarged or tender. Left salivary gland is not diffusely enlarged or tender.     Right Ear: Tympanic membrane, ear canal and external ear normal. There is no impacted cerumen. Tympanic membrane is not erythematous.     Left Ear: Tympanic membrane, ear canal and external ear normal. There is no impacted cerumen. Tympanic membrane is not erythematous.     Nose: Rhinorrhea present. No congestion.     Right Sinus: No maxillary sinus tenderness or frontal sinus tenderness.     Left Sinus: No maxillary sinus tenderness or frontal sinus tenderness.     Mouth/Throat:     Lips: Pink.     Mouth: Mucous membranes are moist.     Pharynx: Oropharynx is clear. Uvula midline. No oropharyngeal exudate, posterior oropharyngeal erythema or uvula swelling.     Tonsils: No tonsillar exudate or tonsillar abscesses.  Eyes:     General: No scleral icterus.       Right eye: No discharge.        Left eye: No discharge.     Extraocular Movements: Extraocular movements intact.     Conjunctiva/sclera: Conjunctivae normal.  Neck:     Musculoskeletal: Normal range of motion and neck supple. No neck rigidity or muscular tenderness.     Vascular: No carotid bruit.  Cardiovascular:     Heart sounds: No murmur. No friction rub. No gallop.   Pulmonary:     Effort: Pulmonary effort is normal. No respiratory distress.     Breath sounds: Normal breath sounds. No stridor. No wheezing, rhonchi or rales.  Chest:     Chest wall: No tenderness.  Abdominal:     Palpations: Abdomen is soft.  Musculoskeletal: Normal range of motion.  Lymphadenopathy:     Cervical: No cervical adenopathy.     Right cervical: No superficial, deep or posterior cervical  adenopathy.    Left cervical: No superficial, deep or posterior cervical adenopathy.  Skin:    General: Skin is warm and dry.     Capillary Refill: Capillary refill takes less than 2 seconds.     Findings: Rash present. No signs of injury. Rash is scaling.     Nails: There is no clubbing.      Comments: Very mild skin colored dry scaling skin colored areas on distal bilateral fingertips. Barely visible.  No erythema, or discharge.  No pain.   Neurological:     Mental Status: He is alert and oriented to person, place, and time.     Gait: Gait is intact. Gait normal.     Comments: Patient moves on and off of exam table and in room without difficulty. Gait is normal in hall and in room. Patient is oriented to person place time and situation. Patient answers questions  appropriately and engages in conversation.   Psychiatric:        Mood and Affect: Mood normal.        Behavior: Behavior normal.        Thought Content: Thought content normal.        Judgment: Judgment normal.        Assessment:     Hand, foot and mouth disease (HFMD)      Plan:     Gave and reviewed After Visit Summary(AVS) with patient. Patient is advised to read the after visit summary as well and let the provider know if any question, concerns or clarifications are needed.   Discussed with patient that given the mild presentation  of skin peeling at distal fingertips this is likely resolving hand-foot-and-mouth.  He also has had significant exposure.  Discussed normal progression of viral diagnosis.  Discussed using Eucerin cream emollient, as well as hydrocortisone cream over-the-counter per package instructions if needed.  Follow-up within 1 week if any symptoms persist. Recommend follow-up with his infectious disease physician as needed.    Provider discussed red flags and need to seek immediate medical care if those symptoms occur.  Advised patient call the office or your primary care doctor for an  appointment if no improvement within 72 hours or if any symptoms change or worsen at any time  Advised ER or urgent Care if after hours or on weekend. Call 911 for emergency symptoms at any time.Patinet verbalized understanding of all instructions given/reviewed and treatment plan and has no further questions or concerns at this time.    Patient verbalized understanding of all instructions given and denies any further questions at this time.

## 2018-08-26 NOTE — Unmapped (Signed)
Pt called stating they are packing on weight with Biktarvy and wants to switch to either Tiviacy and viread or  Prior regiment. Pt states they are fine with what ever the provider suggests. Pt is requesting a medication refill for new regiment.

## 2018-08-26 NOTE — Unmapped (Signed)
Met w/ pt today in clinic to start RW paperwork. RW paperwork completed. Expires 03/12/2019. Not eligible for RW B&C services. Eligible for Caps on Charges. (IPL>300%)     IPL=329%, FPL=329%    Gary Campbell  Time Duration of intervention in mins: 5 mins

## 2018-08-27 NOTE — Unmapped (Signed)
Pt called to check on the status of medication change request they called about yesterday 08/26/18. Let pt know Dr. Servando Snare was forwarded the message and to please allow 48hr for the provider to respond. Pt verbalized understanding.

## 2018-08-27 NOTE — Unmapped (Signed)
[facility] Refill Coordination Note  Medication: BIKTARVY    Unable to reach patient to schedule shipment for medication being filled at [facility]. Left voicemail on phone.  As this is the 3rd unsuccessful attempt to reach the patient, no additional phone call attempts will be made at this time.      Phone numbers attempted: [phone]  [phone]  Last scheduled delivery: SHIPPED 2/17    Please call the [facility] at [phone] (option [phone]) should you have any further questions.      Thanks,  [facility] Specialty Team

## 2018-09-01 DIAGNOSIS — B20 Human immunodeficiency virus [HIV] disease: Principal | ICD-10-CM

## 2018-09-01 MED ORDER — EMTRICITABINE 200 MG-TENOFOVIR DISOPROXIL FUMARATE 300 MG TABLET
ORAL_TABLET | Freq: Every day | ORAL | 11 refills | 30 days | Status: CP
Start: 2018-09-01 — End: 2019-03-05
  Filled 2018-09-03: qty 30, 30d supply, fill #0

## 2018-09-01 MED ORDER — DARUNAVIR 800 MG-COBICISTAT 150 MG TABLET
ORAL_TABLET | Freq: Every day | ORAL | 11 refills | 30.00000 days | Status: CP
Start: 2018-09-01 — End: ?
  Filled 2018-09-03: qty 30, 30d supply, fill #0

## 2018-09-02 DIAGNOSIS — B20 Human immunodeficiency virus [HIV] disease: Principal | ICD-10-CM

## 2018-09-02 NOTE — Unmapped (Signed)
[facility] Special[facility] Medication Referral: [place]  And Financial Assistance Available    Medication (Brand/Generic): Truvada    Final Test Claim completed with resulted information below:    Patient ABLE to fill at [facility]  Insurance Company:  [employer]  Anticipated Copay: $0  Is anticipated copay with a copay card or grant? Yes, there was a copay card approved for this patient.     Does patient's insurance plan only allow a 15 day supply for the first 6 fills in the Split Fill Program? No  If yes, inform patient they can request to dis-enroll from the [facility] by calling the patient help desk at N/A.      If the copay is under the $25 defined limit, per policy there will be no further investigation of need for financial assistance at this time unless patient requests. This referral has been communicated to the provider and handed off to the [facility] team for further processing and filling of prescribed medication.   ______________________________________________________________________  Please utilize this referral for viewing purposes as it will serve as the central location for all relevant documentation and updates.

## 2018-09-03 MED FILL — PREZCOBIX 800 MG-150 MG TABLET: 30 days supply | Qty: 30 | Fill #0 | Status: AC

## 2018-09-03 MED FILL — TRUVADA 200 MG-300 MG TABLET: 30 days supply | Qty: 30 | Fill #0 | Status: AC

## 2018-09-03 NOTE — Unmapped (Signed)
[facility]   Patient Onboarding/Medication Counseling    Gary Campbell is a 48 y.o. male with HIV who I am counseling today on initiation of therapy.  I am speaking to the patient.    Verified patient's date of birth / HIPAA.    Specialty medication(s) to be sent: Infectious Disease: Prezcobix and Truvada      Non-specialty medications/supplies to be sent: n/a      Medications not needed at this time: n/a         1. Prezcobix    The patient declined counseling on medication administration, missed dose instructions, goals of therapy, side effects and monitoring parameters, warnings and precautions, drug/food interactions and storage, handling precautions, and disposal because they have taken the medication previously. The information in the declined sections below are for informational purposes only and was not discussed with patient.       Medication & Administration     Dosage: Take 1 tablet by mouth daily    Administration: Take with food    Adherence/Missed dose instructions: take missed dose as soon as you remember. If it is close to the time of your next dose, skip the dose and resume with your next scheduled dose.    Goals of Therapy     To keep HIV levels at a non-detectable level on lab tests    Side Effects & Monitoring Parameters     Common Side Effects:    ? Headache.  ? Stomach pain or diarrhea.  ? Upset stomach or throwing up.      The following side effects should be reported to the provider:    ? Signs of an allergic reaction, like rash; hives; itching; red, swollen, blistered, or peeling skin with or without fever; wheezing; tightness in the chest or throat; trouble breathing, swallowing, or talking; unusual hoarseness; or swelling of the mouth, face, lips, tongue, or throat.  ? Signs of high blood sugar like confusion, feeling sleepy, more thirst, more hungry, passing urine more often, flushing, fast breathing, or breath that smells like fruit.  ? Signs of kidney problems like unable to pass urine, change in how much urine is passed, blood in the urine, or a big weight gain.  ? Feeling very tired or weak.  ? Change in body fat.  ? Muscle or joint pain.  ? A very bad skin reaction (Stevens-Johnson syndrome/toxic epidermal necrolysis) may happen. It can cause very bad health problems that may not go away, and sometimes death. Get medical help right away if you have signs like red, swollen, blistered, or peeling skin (with or without fever); red or irritated eyes; or sores in your mouth, throat, nose, or eyes.  ? Changes in your immune system can happen when you start taking drugs to treat HIV. If you have an infection that you did not know you had, it may show up when you take this drug. Tell your doctor right away if you have any new signs after you start this drug, even after taking it for several months. This includes signs of infection like fever, sore throat, weakness, cough, or shortness of breath.  ? Liver problems have rarely happened with this drug. Sometimes, this has been deadly. Call your doctor right away if you have signs of liver problems like dark urine, feeling tired, not hungry, upset stomach or stomach pain, light-colored stools, throwing up, or yellow skin or eyes.    Monitoring Parameters:    ? Viral load  ?  Baseline genotyping in treatment-experienced patients (if possible)  ? Serum glucose;  ? Liver function prior to and during therapy (increase monitoring in patients at risk for liver impairment),  ? Cholesterol and triglycerides  ? CBC with differential, reticulocyte count,  ? CD4 count,  ? Serum creatinine at baseline and when clinically indicated during therapy;  ? When coadministered with tenofovir, serum creatinine, urine glucose, and urine protein prior to initiation and as clinically indicated during therapy;  ? Assess serum phosphorus in patients with or at risk for renal impairment. Patients who experience a confirmed increase in serum creatinine >0.4 mg/dL from baseline should have renal function monitored closely.  ? Testing for HBV is recommended prior to the initiation of antiretroviral therapy.  ?   Contraindications, Warnings, & Precautions     Contraindications  Coadministration with alfuzosin, carbamazepine, cisapride, colchicine (in patients with renal and/or hepatic impairment), dronedarone, elbasvir/grazoprevir, ergot derivatives (eg, dihydroergotamine, ergonovine, ergotamine, methylergonovine), ivabradine, lomitapide, lovastatin, lurasidone, oral midazolam, naloxegol, phenobarbital, phenytoin, pimozide, ranolazine, rifampin, sildenafil (for treatment of pulmonary arterial hypertension), simvastatin, St John's wort, triazolam  severe hepatic impairment (Child-Pugh class C). Coadministration with amiodarone, apixaban, lidocaine (systemic), rivaroxaban, salmeterol, ticagrelor, bepridil (not available in [place]).    Warnings/Precautions  Concerns related to adverse effects:    ??? Fat redistribution: May cause redistribution of fat (eg, buffalo hump, peripheral wasting with increased abdominal girth, cushingoid appearance).  ??? Hepatotoxicity: Infrequent cases of drug-induced hepatitis (including acute and cytolytic) have been reported with darunavir. Liver injury has been reported (including some fatalities), though generally in patients on multiple medications, with advanced HIV disease or preexisting liver disease, hepatitis B/C coinfection, and/or immune reconstitution syndrome. Monitor LFTs closely at baseline and during treatment in all patients; in patients with baseline elevations, consider increased monitoring, especially in the first few months of therapy. Consider interrupting or discontinuing therapy if signs/symptoms of new or worsening liver impairment (eg, clinically significant LFT elevations, fatigue, anorexia, nausea, jaundice, dark urine, liver tenderness, hepatomegaly) occur.    ??? Hypersensitivity reactions: Protease inhibitors have been associated with a variety of hypersensitivity events (some severe), including rash, anaphylaxis (rare), angioedema, bronchospasm, erythema multiforme, Stevens-Johnson syndrome (rare), acute generalized exanthematous pustulosis, toxic epidermal necrolysis, and/or drug rash with eosinophilia and systemic symptoms (DRESS). Discontinue treatment if severe skin reactions develop. Severe skin reactions may be accompanied by fever, malaise, fatigue, arthralgias, hepatitis, oral lesions, blisters, conjunctivitis, and/or eosinophilia. Mild to moderate rash may occur early in treatment and resolve with continued therapy.    ??? Immune reconstitution syndrome: Patients may develop immune reconstitution syndrome resulting in the occurrence of an inflammatory response to an indolent or residual opportunistic infection during initial HIV treatment or activation of autoimmune disorders (eg, Graves disease, polymyositis, Guillain-Barr?? syndrome, autoimmune hepatitis) later in therapy; further evaluation and treatment may be required.    ??? Renal toxicity: Concomitant use of cobicistat and tenofovir may cause renal toxicity (acute renal failure and/or Fanconi syndrome); avoid use with concurrent or recent nephrotoxic therapy. Calculate estimated creatinine clearance (CrCl) prior to initiation of therapy and monitor renal function (including recalculation of CrCl and serum phosphorus) during therapy. In patients receiving concomitant tenofovir, assess urine glucose and urine protein prior to and periodically during treatment; assess serum phosphorus in patients with or at risk for renal impairment. Do not initiate therapy in combination with tenofovir in patients with CrCl <70 mL/minute.    ??? Sulfonamide allergy: Use with caution in patients with sulfonamide allergy (darunavir contains sulfa moiety).    Disease-related concerns:    ???  Diabetes: Changes in glucose tolerance, hyperglycemia, exacerbation of diabetes, DKA, and new-onset diabetes mellitus have been reported in patients receiving protease inhibitors. Initiation or dose adjustments of antidiabetic agents may be required.    ??? Hemophilia A or B: Use with caution in patients with hemophilia A or B; increased bleeding (eg, spontaneous skin hematomas and hemarthroses) has been reported during protease inhibitor therapy. Some patients receive additional factor VIII. In more than half of the cases, protease inhibitor treatment was continued or reintroduced if treatment was discontinued.    ??? Hepatic impairment: Darunavir may exacerbate preexisting hepatic impairment; use with caution in patients with underlying hepatic disease, such as hepatitis B or C or cirrhosis. Use in severe impairment (Child-Pugh class C) is not recommended; closely monitor patients with mild or moderate impairment (Child-Pugh class A or B).    Concurrent drug therapy issues:    ??? Drug-drug interactions: Potentially significant interactions may exist, requiring dose or frequency adjustment, additional monitoring, and/or selection of alternative therapy. Consult drug interactions database for more detailed information.    Other warning/precautions:    ??? False elevations in serum creatinine: Cobicistat may inhibit tubular secretion of creatinine without affecting actual renal glomerular function; use caution when interpreting serum creatinine values in patients with medical conditions or receiving drugs needing to be monitored with estimated CrCl. Patients who experience a confirmed increase in serum creatinine >0.4 mg/dL from baseline should have renal function monitored closely. Assess estimated CrCl prior to initiating therapy; consider alternative medications that do not require dosage adjustments in patients with renal impairment.    Pregnancy Considerations  The Health and Human Services (HHS) perinatal HIV guidelines do not recommend this fixed-dose combination for pregnant females living with HIV who are antiretroviral-naive, who have had antiretroviral therapy (ART) in the past but are restarting, who require a new ART regimen (due to poor tolerance or poor virologic response of current regimen), and who are not yet pregnant but are trying to conceive. For females who become pregnant while taking this combination, consider altering the regimen, or continue with frequent monitoring if viral suppression is effective and the regimen is well tolerated (HHS [perinatal] 2019).    Drug/Food Interactions     ? Medication list reviewed in Epic. The patient was instructed to inform the care team before taking any new medications or supplements. No drug interactions identified.       Storage, Handling Precautions, & Disposal   ? Store at room temperature.  ? Store in a dry place. Do not store in a bathroom.  ? Keep all drugs in a safe place. Keep all drugs out of the reach of children and pets.  ? Throw away unused or expired drugs. Do not flush down a toilet or pour down a drain unless you are told to do so. Check with your pharmacist if you have questions about the best way to throw out drugs. There may be drug take-back programs in your area.    2. Truvada    The patient declined counseling on medication administration, missed dose instructions, goals of therapy, side effects and monitoring parameters, warnings and precautions, drug/food interactions and storage, handling precautions, and disposal because they have taken the medication previously. The information in the declined sections below are for informational purposes only and was not discussed with patient.       Medication & Administration     Dosage: Take one tablet by mouth once daily.    Administration: Take with or without food  Adherence/Missed dose instructions: take missed dose as soon as you remember. If it is close to the time of your next dose, skip the dose and resume with your next scheduled dose.    Goals of Therapy     The goal is to keep HIV levels at a non-detectable level on lab tests    Side Effects & Monitoring Parameters   Common Side Effects:     ? Upset stomach  ? Diarrhea  ? Not able to sleep  ? Dizziness.   ? Headache  ? Strange or odd dreams  ? Feeling tired or weak.      The following side effects should be reported to the provider:    ? Signs of an allergic reaction, like rash; hives; itching; red, swollen, blistered, or peeling skin with or without fever; wheezing; tightness in the chest or throat; trouble breathing, swallowing, or talking; unusual hoarseness; or swelling of the mouth, face, lips, tongue, or throat  ? Signs of kidney problems like unable to pass urine, change in how much urine is passed, blood in the urine, or a big weight gain.  ? Signs of liver problems like dark urine, feeling tired, not hungry, upset stomach or stomach pain, light-colored stools, throwing up, or yellow skin or eyes.   ? Signs of too much lactic acid in the blood (lactic acidosis) like fast breathing, fast heartbeat, a heartbeat that does not feel normal, very bad upset stomach or throwing up, feeling very sleepy, shortness of breath, feeling very tired or weak, very bad dizziness, feeling cold, or muscle pain or cramps.   ? Low mood (depression).   ? Bone pain.   ? Muscle pain or weakness.   ? Pain in arms or legs.    Monitoring Parameters:     ? CBC with differential  ? CD4 count  ? HIV RNA plasma levels  ? serum phosphorus  ? serum creatinine,  ? urine glucose and urine protein (prior to initiation and as clinically indicated during therapy)  ? hepatic function tests,  ? bone density (patients with a history of bone fracture or have risk factors for bone loss);  ? testing for HBV is recommended prior to the initiation of antiretroviral therapy    Contraindications, Warnings, & Precautions     ? Decreased bone densitiy  ? Lactic acidosis  ? Renal toxicicy  ? Chronic hepatitis B: [US Boxed Warning]: Acute, severe exacerbations of HBV have been reported in HBV-infected patients following discontinuation of antiretroviral therapy. Closely monitor hepatic function with clinical and laboratory follow-up for at least several months in patients infected with HBV who discontinue this therapy. If appropriate, anti-hepatitis B therapy may be warranted, especially in patients with advanced liver disease or cirrhosis. All patients with HIV should be tested for HBV prior to initiation of treatment; HBV-uninfected patients should be offered vaccination.    Drug/Food Interactions     ? Medication list reviewed in Epic. The patient was instructed to inform the care team before taking any new medications or supplements. No drug interactions identified.     Storage, Handling Precautions, & Disposal     ? Store in the original container at room temperature  ? Keep lid tightly closed  ? Store in a dry place. Do not store in a bathroom.  ? Keep all drugs in a safe place. Keep all drugs out of the reach of children and pets  ? Throw away unused or expired drugs. Do not flush down a  toilet or pour down a drain unless you are told to do so. Check with your pharmacist if you have questions about the best way to throw out drugs. There may be drug take-back programs in your area            Current Medications (including OTC/herbals), Comorbidities and Allergies     Current Outpatient Medications   Medication Sig Dispense Refill   ??? darunavir-cobicistat (PREZCOBIX) 800-150 mg-mg tablet Take 1 tablet by mouth daily. 30 tablet 11   ??? emtricitabine-tenofovir, TDF, (TRUVADA) 200-300 mg per tablet Take 1 tablet by mouth daily. 30 tablet 11   ??? hydroCHLOROthiazide (HYDRODIURIL) 25 MG tablet Take 1 tablet (25 mg total) by mouth daily. 90 tablet 10   ??? ibuprofen (ADVIL,MOTRIN) 200 MG tablet Take 400 mg by mouth every six (6) hours as needed for pain.     ??? sildenafil (VIAGRA) 100 MG tablet Take 1 tablet (100 mg total) by mouth once as needed for erectile dysfunction for up to 1 dose. 1 tablet 6   ??? sodium,potassium,mag sulfates (SUPREP BOWEL PREP KIT) 17.5-3.13-1.6 gram SolR Take as directed instructions mailed to patient 1 Bottle 0     No current facility-administered medications for this visit.        No Known Allergies    Patient Active Problem List   Diagnosis   ??? HIV disease (CMS-HCC)   ??? HBV (hepatitis B virus) infection       Reviewed and up to date in Epic.    Appropriateness of Therapy     Is medication and dose appropriate based on diagnosis? Yes    Baseline Quality of Life Assessment      How many days over the past month did your HIV keep you from your normal activities? 0    Financial Information     Medication Assistance provided: Copay Assistance for both medications    Anticipated copay of $0.00 for both medications  reviewed with patient. Verified delivery address.    Delivery Information     Scheduled delivery date: 09/04/2018    Expected start date: 09/04/2018    Medication will be delivered via UPS to the home address in [facility].  This shipment will not require a signature.      Explained the services we provide at [facility] and that each month we would call to set up refills.  Stressed importance of returning phone calls so that we could ensure they receive their medications in time each month.  Informed patient that we should be setting up refills 7-10 days prior to when they will run out of medication.  A pharmacist will reach out to perform a clinical assessment periodically.  Informed patient that a welcome packet and a drug information handout will be sent.      Patient verbalized understanding of the above information as well as how to contact the pharmacy at [phone] option 4 with any questions/concerns.  The pharmacy is open Monday through Friday 8:30am-4:30pm.  A pharmacist is available 24/7 via pager to answer any clinical questions they may have.    Patient Specific Needs     ? Does the patient have any physical, cognitive, or cultural barriers? No ? Patient prefers to have medications discussed with  Patient     ? Is the patient able to read and understand education materials at a high school level or above? Yes    ? Patient's primary language is  English     ?  Is the patient high risk? No     ? Does the patient require a Care Management Plan? No     ? Does the patient require physician intervention or other additional services (i.e. nutrition, smoking cessation, social work)? No      Roderic Palau  [facility] Specialty Pharmacist

## 2018-09-03 NOTE — Unmapped (Signed)
[facility] Specialty Medication Referral: PA and Financial Assistance Approved      Medication (Brand/Generic): Prezcobix    Final Test Claim completed with resulted information below:    Patient ABLE to fill at [facility]  Insurance Company:  [employer]uerbach  Anticipated Copay: $0  Is anticipated copay with a copay card or grant? Yes, there was a copay card approved for this patient.     Does patient's insurance plan only allow a 15 day supply for the first 6 fills in the Split Fill Program? No  If yes, inform patient they can request to dis-enroll from the [facility] by calling the patient help desk at N/A.      If the copay is under the $25 defined limit, per policy there will be no further investigation of need for financial assistance at this time unless patient requests. This referral has been communicated to the provider and handed off to the [facility] team for further processing and filling of prescribed medication.   ______________________________________________________________________  Please utilize this referral for viewing purposes as it will serve as the central location for all relevant documentation and updates.

## 2018-09-25 MED FILL — TRUVADA 200 MG-300 MG TABLET: ORAL | 30 days supply | Qty: 30 | Fill #1

## 2018-09-25 MED FILL — PREZCOBIX 800 MG-150 MG TABLET: ORAL | 30 days supply | Qty: 30 | Fill #1

## 2018-09-25 MED FILL — PREZCOBIX 800 MG-150 MG TABLET: 30 days supply | Qty: 30 | Fill #1 | Status: AC

## 2018-09-25 MED FILL — TRUVADA 200 MG-300 MG TABLET: 30 days supply | Qty: 30 | Fill #1 | Status: AC

## 2018-09-25 NOTE — Unmapped (Signed)
[facility] Clinical Assessment & Refill Coordination Note    Gary Campbell, DOB: 1970/07/17  Phone: [phone] (home)     All above HIPAA information was verified with patient.     Specialty Medication(s):   Infectious Disease: Prezcobix and Truvada     Current Outpatient Medications   Medication Sig Dispense Refill   ??? darunavir-cobicistat (PREZCOBIX) 800-150 mg-mg tablet Take 1 tablet by mouth daily. 30 tablet 11   ??? emtricitabine-tenofovir, TDF, (TRUVADA) 200-300 mg per tablet Take 1 tablet by mouth daily. 30 tablet 11   ??? hydroCHLOROthiazide (HYDRODIURIL) 25 MG tablet Take 1 tablet (25 mg total) by mouth daily. 90 tablet 10   ??? ibuprofen (ADVIL,MOTRIN) 200 MG tablet Take 400 mg by mouth every six (6) hours as needed for pain.     ??? sildenafil (VIAGRA) 100 MG tablet Take 1 tablet (100 mg total) by mouth once as needed for erectile dysfunction for up to 1 dose. 1 tablet 6   ??? sodium,potassium,mag sulfates (SUPREP BOWEL PREP KIT) 17.5-3.13-1.6 gram SolR Take as directed instructions mailed to patient 1 Bottle 0     No current facility-administered medications for this visit.         Changes to medications: [name] reports no changes at this time.    No Known Allergies    Changes to allergies: No    SPECIALTY MEDICATION ADHERENCE     Truvada 200-300 mg: 7 days of medicine on hand   Prezcobix   : 7 days of medicine on hand       Medication Adherence    Patient reported X missed doses in the last month:  0  Specialty Medication:  Truvada  Patient is on additional specialty medications:  Yes  Additional Specialty Medications:  Prezcobix  Patient Reported Additional Medication X Missed Doses in the Last Month:  0  Informant:  patient          Specialty medication(s) dose(s) confirmed: Regimen is correct and unchanged.     Are there any concerns with adherence? No    Adherence counseling provided? Not needed    CLINICAL MANAGEMENT AND INTERVENTION      Clinical Benefit Assessment:    Do you feel the medicine is effective or helping your condition? Yes    HIV ASSOCIATED LABS:     Lab Results   Component Value Date/Time    HIVRS Detected (A) 07/08/2018 09:18 AM    HIVRS Detected (A) 01/07/2018 09:35 AM    HIVRS Not Detected 08/06/2017 10:33 AM    HIVCP <40 (H) 07/08/2018 09:18 AM    HIVCP <40 (H) 01/07/2018 09:35 AM    HIVCP 41 (H) 11/09/2017 02:25 PM    ACD4 432 (L) 07/08/2018 09:18 AM    ACD4 316 (L) 01/07/2018 09:35 AM    ACD4 279 (L) 11/09/2017 02:25 PM         Clinical Benefit counseling provided? Labs from 07/08/2018 show evidence of clinical benefit    Adverse Effects Assessment:    Are you experiencing any side effects? No    Are you experiencing difficulty administering your medicine? No    Quality of Life Assessment:    How many days over the past month did your HIV  keep you from your normal activities? For example, brushing your teeth or getting up in the morning. 0    Have you discussed this with your provider? Not needed    Therapy Appropriateness:    Is therapy appropriate?  Yes, therapy is appropriate and should be continued    DISEASE/MEDICATION-SPECIFIC INFORMATION      N/A    PATIENT SPECIFIC NEEDS     ? Does the patient have any physical, cognitive, or cultural barriers? No    ? Is the patient high risk? No     ? Does the patient require a Care Management Plan? No     ? Does the patient require physician intervention or other additional services (i.e. nutrition, smoking cessation, social work)? No      SHIPPING     Specialty Medication(s) to be Shipped:   Infectious Disease: Prezcobix and Truvada    Other medication(s) to be shipped: n/a     Changes to insurance: No    Delivery Scheduled: Yes, Expected medication delivery date: 09/26/2018.     Medication will be delivered via UPS to the confirmed home address in [facility].    The patient will receive a drug information handout for each medication shipped and additional FDA Medication Guides as required.  Verified that patient has previously received a Conservation officer, historic buildings.    Roderic Palau   [facility] Specialty Pharmacist

## 2018-10-17 NOTE — Unmapped (Signed)
[facility] Pharmacy Refill Coordination Note    Specialty Medication(s) to be Shipped:   Infectious Disease: Prezcobix and Truvada    Other medication(s) to be shipped: N/A     Gary Campbell, DOB: [birth year]  Phone: [phone] (home)       All above HIPAA information was verified with patient.     Completed refill call assessment today to schedule patient's medication shipment from the [facility] [phone]).       Specialty medication(s) and dose(s) confirmed: Regimen is correct and unchanged.   Changes to medications: [name] reports no changes at this time.  Changes to insurance: No  Questions for the pharmacist: No    Confirmed patient received Welcome Packet with first shipment. The patient will receive a drug information handout for each medication shipped and additional FDA Medication Guides as required.       DISEASE/MEDICATION-SPECIFIC INFORMATION        N/A    SPECIALTY MEDICATION ADHERENCE     Medication Adherence    Patient reported X missed doses in the last month:  0  Specialty Medication:  PREZCOBIX  Patient is on additional specialty medications:  Yes  Additional Specialty Medications:  TRUVADA  Patient Reported Additional Medication X Missed Doses in the Last Month:  0                    SHIPPING     Shipping address confirmed in Epic.     Delivery Scheduled: Yes, Expected medication delivery date: 5/12 PER PATIENT REQUEST.     Medication will be delivered via UPS to the home address in [place].    [name]   [facility] Specialty Technician

## 2018-10-21 MED FILL — PREZCOBIX 800 MG-150 MG TABLET: ORAL | 30 days supply | Qty: 30 | Fill #2

## 2018-10-21 MED FILL — TRUVADA 200 MG-300 MG TABLET: 30 days supply | Qty: 30 | Fill #2 | Status: AC

## 2018-10-21 MED FILL — PREZCOBIX 800 MG-150 MG TABLET: 30 days supply | Qty: 30 | Fill #2 | Status: AC

## 2018-10-21 MED FILL — TRUVADA 200 MG-300 MG TABLET: ORAL | 30 days supply | Qty: 30 | Fill #2

## 2018-11-14 NOTE — Unmapped (Signed)
[facility] Special[facility] Pharmacy Refill Coordination Note    Specialty Medication(s) to be Shipped:   Infectious Disease: Prezcobix and Tivicay    Other medication(s) to be shipped:      [name], DOB: Oct 18, 1970  Phone: [phone] (home)       All above HIPAA information was verified with patient.     Completed refill call assessment today to schedule patient's medication shipment from the [facility] [phone]).       Specialty medication(s) and dose(s) confirmed: Regimen is correct and unchanged.   Changes to medications: [name] reports no changes at this time.  Changes to insurance: No  Questions for the pharmacist: No    Confirmed patient received Welcome Packet with first shipment. The patient will receive a drug information handout for each medication shipped and additional FDA Medication Guides as required.       DISEASE/MEDICATION-SPECIFIC INFORMATION        N/A    SPECIALTY MEDICATION ADHERENCE     Medication Adherence    Patient reported X missed doses in the last month:  0  Specialty Medication:  prezcobix  Patient is on additional specialty medications:  Yes  Additional Specialty Medications:  truvada  Patient Reported Additional Medication X Missed Doses in the Last Month:  0  Patient is on more than two specialty medications:  No                prezcobix 800-150 mg: 7 days of medicine on hand   truvada 200-300 mg: 7 days of medicine on hand         SHIPPING     Shipping address confirmed in Epic.     Delivery Scheduled: Yes, Expected medication delivery date: 11/19/18.     Medication will be delivered via UPS to the home address in Epic [place].    Oralia Rud    [facility] Specialty Technician

## 2018-11-18 MED FILL — TRUVADA 200 MG-300 MG TABLET: ORAL | 30 days supply | Qty: 30 | Fill #3

## 2018-11-18 MED FILL — PREZCOBIX 800 MG-150 MG TABLET: ORAL | 30 days supply | Qty: 30 | Fill #3

## 2018-11-18 MED FILL — TRUVADA 200 MG-300 MG TABLET: 30 days supply | Qty: 30 | Fill #3 | Status: AC

## 2018-11-18 MED FILL — PREZCOBIX 800 MG-150 MG TABLET: 30 days supply | Qty: 30 | Fill #3 | Status: AC

## 2018-11-19 NOTE — Unmapped (Signed)
viagra refill

## 2018-11-20 MED ORDER — SILDENAFIL 100 MG TABLET
ORAL_TABLET | Freq: Once | ORAL | 6 refills | 0 days | Status: CP | PRN
Start: 2018-11-20 — End: 2018-11-26

## 2018-11-20 NOTE — Unmapped (Signed)
Per test claim for Sildenafil at the [facility], patient needs Medication Assistance Program for Prior Authorization.

## 2018-11-26 MED FILL — SILDENAFIL 100 MG TABLET: 1 days supply | Qty: 1 | Fill #0 | Status: AC

## 2018-11-26 NOTE — Unmapped (Signed)
Duration of Intervention: 5 Minutes    SW received phone call from patient today. Pt is in need of a refill of Sildenafil so SW notified pt's assigned nurse, Sharion Dove, of request.    Pt also informed SW that his insurance coverage will end on 07/01 and he will be eligible for a new plan on 08/01. He inquired about options for medication coverage.  SW provided pt with the contact number for the benefits counselor on call, Avel Peace, and also notified Ms. Madilyn Fireman of pt's situation.      Pt has no further needs today.    Arlyn Dunning, MSW, LCSW

## 2018-11-27 MED ORDER — SILDENAFIL 100 MG TABLET
ORAL_TABLET | Freq: Once | ORAL | 6 refills | 0.00000 days | Status: CP | PRN
Start: 2018-11-27 — End: ?
  Filled 2018-11-26: qty 1, 1d supply, fill #0

## 2018-12-02 NOTE — Unmapped (Signed)
Rcvd notification that pt will require assistance accessing medications soon. Appears that pt will be about a month or so without health insurance coverage. Called pt at number located in epic to discuss current situation and options available for accessing meds until new health insurance is active. Rcvd pts vm. Unable to leave a message due to pts mailbox full.     Pt has active [employer] [facility]. Sent mychart message to pt requesting that pt return call and that pt please place last refill on meds prior to health insurance coverage ending. Left my direct contact info in vm.     Avel Peace  Time Duration of intervention in mins: 15 mins

## 2018-12-09 NOTE — Unmapped (Signed)
Called pt at number located in epic to s/w pt regarding medication assistance during the time he will be without health insurance coverage. S/w pt today. Pt indicated he is in between jobs and plans to start employment in Aug at which point new health insurance will become active. Pt needs assistance accessing medications for about a month or two until new coverage is active. Oriented pt on HMAP. Due to COVID-19, application can be completed verbally over the phone w/ pts and submitted electronically for review. Completed verbal HMAP app w/ pt. Pt stated he has about 2 to 3 weeks of meds left.     A financial assessment was completed to determine eligibility for patient financial assistance programs.   Completed assessment received via mail    Household FPL: 0%  Individual FPL: 0%    Insurance  Patient reported no health insurance coverage and verified via EPIC records    [employer] (RW)   Patient is eligible for [employer] and Caps on Charges as of today. RW flag was set based on IPL for Caps on Charges.     HMAP/UMAP (Uninsured Medication Access Program) application is (choose all that apply):   NEW APPLICATION. Application is pending approval by the [facility] program for medication assistance at this time.    Other UMAP sub-program information details  -Patient can only fill at [facility] approved [facility] (Appendix B of HMAP manual)   -Only covers medications listed in the UMAP formulary (Appendix D of HMAP manual)  -You can find the HMAP manual at Computer[employer].si    Patient was informed about the following programs:   [facility] for financial assistance for medical and hospital bills. Patient was provided financial assistance material and referred to a [facility].     Federal Marketplace Eligibility Assessment  Patient is not eligible for a [employer] through the Marketplace because patient is below income.     Additional Comments:    Time Duration of intervention in minutes: 30 mins

## 2018-12-23 NOTE — Unmapped (Signed)
The [facility] has made a third and final attempt to reach this patient to refill the following medication:PREZCOBIX AND TRUVADA.      We have left voicemails on the following phone numbers: [phone] and have sent a MyChart message.    Dates contacted: 7/1, 7/9, 7/13  Last scheduled delivery: SHIPPED 6/8.    **NOTE- PATIENT CURRENTLY IS BETWEEN INSURANCE COVERAGE. PATIENT IS PENDING APPROVAL FOR UHMAP. ALSO, MY MYCHART MESSAGE INCLUDED INFORMATION WITH THE PHONE NUMBER, TO SIGN UP FOR PAP, WHICH COVERS BOTH OF THE PATIENT'S SPECIALTY MEDICATIONS.    The patient may be at risk of non-compliance with this medication. The patient should call the [facility] at [phone] (option 4) to refill medication.    Gary Campbell   [facility] Specialty Technician

## 2018-12-24 NOTE — Unmapped (Signed)
[facility] Specialty Pharmacy Refill Coordination Note    Specialty Medication(s) to be Shipped:   Infectious Disease: Prezcobix and Truvada    Other medication(s) to be shipped: na     Gary Campbell, DOB: 1970/06/23  Phone: [phone] (home)       All above HIPAA information was verified with patient.     Completed refill call assessment today to schedule patient's medication shipment from the [facility] [phone]).       Specialty medication(s) and dose(s) confirmed: Regimen is correct and unchanged.   Changes to medications: Gary Campbell reports no changes at this time.  Changes to insurance: No  Questions for the pharmacist: No    Confirmed patient received Welcome Packet with first shipment. The patient will receive a drug information handout for each medication shipped and additional FDA Medication Guides as required.       DISEASE/MEDICATION-SPECIFIC INFORMATION        N/A    SPECIALTY MEDICATION ADHERENCE     Medication Adherence    Patient reported X missed doses in the last month: 0  Specialty Medication: Prezcobix  Patient is on additional specialty medications: Yes  Additional Specialty Medications: Truvada  Patient Reported Additional Medication X Missed Doses in the Last Month: 0  Patient is on more than two specialty medications: No  Any gaps in refill history greater than 2 weeks in the last 3 months: no  Demonstrates understanding of importance of adherence: yes  Informant: patient  Reliability of informant: reliable  Confirmed plan for next specialty medication refill: delivery by pharmacy  Refills needed for supportive medications: not needed                Prezcobix. 8 days on hand  Truvada. 8 days on hand      SHIPPING     Shipping address confirmed in Epic.     Delivery Scheduled: Yes, Expected medication delivery date: 071620.     Medication will be delivered via UPS to the home address in Epic WAM.    Vivyan Biggers D Chirstina Haan   [facility] Specialty Technician

## 2018-12-24 NOTE — Unmapped (Signed)
Rcvd notification from [facility] that pts [employer] coverage is still showing active. Performed an Engineer, manufacturing systems check through passport which also yielded active results. S/w pt today at number located in epic. Informed pt of active coverage. Pt was surprised but stated that he would contact pharmacy in regards to placing refill. Advised pt to please contact me should he have any issues w/ the pharmacy trying to process insurance.     Pt no longer eligible for HMAP due to insurance is still active. Pt is only eligible for RW.     Gary Campbell  Time Duration of intervention in mins: 25 mins

## 2018-12-25 MED FILL — PREZCOBIX 800 MG-150 MG TABLET: 30 days supply | Qty: 30 | Fill #4 | Status: AC

## 2018-12-25 MED FILL — PREZCOBIX 800 MG-150 MG TABLET: ORAL | 30 days supply | Qty: 30 | Fill #4

## 2018-12-25 MED FILL — TRUVADA 200 MG-300 MG TABLET: ORAL | 30 days supply | Qty: 30 | Fill #4

## 2018-12-25 MED FILL — TRUVADA 200 MG-300 MG TABLET: 30 days supply | Qty: 30 | Fill #4 | Status: AC

## 2019-01-31 NOTE — Unmapped (Signed)
[facility] Pharm[facility] Refill Coordination Note    Specialty Medication(s) to be Shipped:   Infectious Disease: Prezcobix and Truvada    Other medication(s) to be shipped: n/a     [name], DOB: [birth year]  Phone: [phone] (home)       All above HIPAA information was verified with patient.     Completed refill call assessment today to schedule patient's medication shipment from the [facility] [phone]).       Specialty medication(s) and dose(s) confirmed: Regimen is correct and unchanged.   Changes to medications: [name] reports no changes at this time.  Changes to insurance: No  Questions for the pharmacist: No    Confirmed patient received Welcome Packet with first shipment. The patient will receive a drug information handout for each medication shipped and additional FDA Medication Guides as required.       DISEASE/MEDICATION-SPECIFIC INFORMATION        N/A    SPECIALTY MEDICATION ADHERENCE     Medication Adherence    Patient reported X missed doses in the last month: 0  Specialty Medication: PREZCOBIX  Patient is on additional specialty medications: Yes  Additional Specialty Medications: TRUVADA  Patient Reported Additional Medication X Missed Doses in the Last Month: 0                Prezcobix 800150 mg: 5 days of medicine on hand   Truvada 200-300 mg: 5 days of medicine on hand         SHIPPING     Shipping address confirmed in Epic.     Delivery Scheduled: Yes, Expected medication delivery date: 02/04/19.     Medication will be delivered via UPS to the home address in Epic [place].    Rewa Weissberg  Anders Grant   [facility] Specialty Pharmacist

## 2019-02-03 MED FILL — TRUVADA 200 MG-300 MG TABLET: 30 days supply | Qty: 30 | Fill #5 | Status: AC

## 2019-02-03 MED FILL — PREZCOBIX 800 MG-150 MG TABLET: 30 days supply | Qty: 30 | Fill #5 | Status: AC

## 2019-02-03 MED FILL — PREZCOBIX 800 MG-150 MG TABLET: ORAL | 30 days supply | Qty: 30 | Fill #5

## 2019-02-03 MED FILL — TRUVADA 200 MG-300 MG TABLET: ORAL | 30 days supply | Qty: 30 | Fill #5

## 2019-03-03 NOTE — Unmapped (Signed)
[facility] Clinical Assessment & Refill Coordination Note    [name], DOB: 02/02/1971  Phone: [phone] (home)     All above HIPAA information was verified with patient.     Specialty Medication(s):   Infectious Disease: Prezcobix and Truvada     Current Outpatient Medications   Medication Sig Dispense Refill   ??? darunavir-cobicistat (PREZCOBIX) 800-150 mg-mg tablet Take 1 tablet by mouth daily. 30 tablet 11   ??? emtricitabine-tenofovir, TDF, (TRUVADA) 200-300 mg per tablet Take 1 tablet by mouth daily. 30 tablet 11   ??? hydroCHLOROthiazide (HYDRODIURIL) 25 MG tablet Take 1 tablet (25 mg total) by mouth daily. 90 tablet 10   ??? ibuprofen (ADVIL,MOTRIN) 200 MG tablet Take 400 mg by mouth every six (6) hours as needed for pain.     ??? sildenafiL (VIAGRA) 100 MG tablet Take 1 tablet (100 mg total) by mouth once as needed for erectile dysfunction for up to 1 dose. 1 tablet 6   ??? sodium,potassium,mag sulfates (SUPREP BOWEL PREP KIT) 17.5-3.13-1.6 gram SolR Take as directed instructions mailed to patient 1 Bottle 0     No current facility-administered medications for this visit.         Changes to medications: [name] reports no changes at this time.    No Known Allergies    Changes to allergies: No    SPECIALTY MEDICATION ADHERENCE     Truvada   : 3 days of medicine on hand   Prezcobix   : 3 days of medicine on hand     Medication Adherence    Patient reported X missed doses in the last month: 0  Specialty Medication: Prezcobix  Patient is on additional specialty medications: Yes  Additional Specialty Medications: Truvada  Patient Reported Additional Medication X Missed Doses in the Last Month: 0  Patient is on more than two specialty medications: No  Any gaps in refill history greater than 2 weeks in the last 3 months: no  Demonstrates understanding of importance of adherence: yes  Informant: patient  Provider-estimated medication adherence level: good  Patient is at risk for Non-Adherence: No          Specialty medication(s) dose(s) confirmed: Regimen is correct and unchanged.     Are there any concerns with adherence? No    Adherence counseling provided? Not needed    CLINICAL MANAGEMENT AND INTERVENTION      Clinical Benefit Assessment:    Do you feel the medicine is effective or helping your condition? Yes    HIV ASSOCIATED LABS:     Lab Results   Component Value Date/Time    HIVRS Detected (A) 07/08/2018 09:18 AM    HIVRS Detected (A) 01/07/2018 09:35 AM    HIVRS Not Detected 08/06/2017 10:33 AM    HIVCP <40 (H) 07/08/2018 09:18 AM    HIVCP <40 (H) 01/07/2018 09:35 AM    HIVCP 41 (H) 11/09/2017 02:25 PM    ACD4 432 (L) 07/08/2018 09:18 AM    ACD4 316 (L) 01/07/2018 09:35 AM    ACD4 279 (L) 11/09/2017 02:25 PM       Clinical Benefit counseling provided? Labs from 07/08/2018 show evidence of clinical benefit    Adverse Effects Assessment:    Are you experiencing any side effects? No    Are you experiencing difficulty administering your medicine? No    Quality of Life Assessment:    How many days over the past month did your HIV  keep you  from your normal activities? For example, brushing your teeth or getting up in the morning. 0    Have you discussed this with your provider? Not needed    Therapy Appropriateness:    Is therapy appropriate? Yes, therapy is appropriate and should be continued    DISEASE/MEDICATION-SPECIFIC INFORMATION      N/A    PATIENT SPECIFIC NEEDS     ? Does the patient have any physical, cognitive, or cultural barriers? No    ? Is the patient high risk? No     ? Does the patient require a Care Management Plan? No     ? Does the patient require physician intervention or other additional services (i.e. nutrition, smoking cessation, social work)? No      SHIPPING     Specialty Medication(s) to be Shipped:   Infectious Disease: Prezcobix and Truvada    Other medication(s) to be shipped: n/a     Changes to insurance: No    Delivery Scheduled: Yes, Expected medication delivery date: 03/05/2019.     Medication will be delivered via UPS to the confirmed home address in [place].    The patient will receive a drug information handout for each medication shipped and additional FDA Medication Guides as required.  Verified that patient has previously received a Conservation officer, historic buildings.    All of the patient's questions and concerns have been addressed.    Roderic Palau   [facility] Specialty Pharmacist

## 2019-03-04 MED FILL — TRUVADA 200 MG-300 MG TABLET: 30 days supply | Qty: 30 | Fill #6 | Status: AC

## 2019-03-04 MED FILL — PREZCOBIX 800 MG-150 MG TABLET: 30 days supply | Qty: 30 | Fill #6 | Status: AC

## 2019-03-04 MED FILL — PREZCOBIX 800 MG-150 MG TABLET: ORAL | 30 days supply | Qty: 30 | Fill #6

## 2019-03-04 MED FILL — TRUVADA 200 MG-300 MG TABLET: ORAL | 30 days supply | Qty: 30 | Fill #6

## 2019-03-17 ENCOUNTER — Institutional Professional Consult (permissible substitution)
Admit: 2019-03-17 | Discharge: 2019-03-18 | Payer: PRIVATE HEALTH INSURANCE | Attending: Infectious Disease | Primary: Infectious Disease

## 2019-03-17 DIAGNOSIS — B20 Human immunodeficiency virus [HIV] disease: Secondary | ICD-10-CM

## 2019-03-17 DIAGNOSIS — B181 Chronic viral hepatitis B without delta-agent: Secondary | ICD-10-CM

## 2019-03-17 NOTE — Unmapped (Signed)
PCP:  Electa Sniff, MD    03/17/2019    This is a RETURN visit for this 48 y.o. male with the following diagnoses:    Patient Active Problem List    Diagnosis Date Noted   ??? HIV disease (CMS-HCC) 08/06/2017   ??? HBV (hepatitis B virus) infection 08/06/2017     NOTE: This is a phone visit. PEx below is for the most recent prior face-to-face visit. 10    ASSESSMENT/PLAN:    HIV  ?? Dx'd in 1989.   ?? Was cared for here until ~2003. Most recently cared for at [facility] by Matilde Haymaker  ?? Was on Viread+Tivicay+Prezcobix when switched over from [facility] to [facility]  ?? On transfer had HIV RNA <20c/mL  ?? Has HBV co-infection  ?? Documented K103N and 184V  ?? I changed him to [facility] which he started May 2019  ?? HIV RNA <40 c/mL in July 2019  ?? Wt has gone up since switch by 10 lbs. He thinks may be diet and exercise related   ?? PLAN:  ?? Recheck labs   ?? Watch weight. He was counseled re potential med effects and he will try to diet to see if this has impact     HTN  ?? Better on HCTZ 25 mg daily  ?? Some ED since starting (lost am erections)  ?? Does not want to change his BP med  ?? 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 On Biktarvy which contains TAF and FTC   ?? HBV DNA repeatedly undetectable   ?? Needs q6 month ultrasounds. Last was in July 2019 and was neg for [facility]. Will order another.  ?? Recheck labs    Insomnia  ?? Good sleep hygiene. Only 1 cup am coffee and no other caffeine   ?? Started before Tivicay and after partner's death  ?? Issue is falling asleep  ?? Melatonin OTC did not work  ?? Sleeps ~4 hours per night. Trouble falling asleep.  ?? BUT, says he feels rested in am.   ?? He may be someone who needs relatively little sleep.     Mental Health  ?? Stable and good    Health Maintenance   ?? Immunizations: Had flu shot. [facility]  ?? Cancer Screening: Family hx of early colon cancer. Has had colonoscopy and due for repeat. US Liver.   ?? Lipids: Not horrible but lowish HDL. AASCVD 10y risk is 9.6%. Will re-check on Biktarvy  ?? Other:   ?? Had gastric sleeve 3 years ago. Has kept wt off. Not on PPIs.  ?? Follow BP  ?? No meds other than ART and HCTZ  ?? Has 3/6 systolic murmur - known and has had echocardiogram he said without concerns.     Will follow up in 6 months.       Chief Complaint: HIV follow-up    HPI:  [name] returns for an appointment.       ROS:   No fever chills, sweats, nausea, vomiting, diarrhea, rash, joint pain. Pos for  insomnia (chronic)    All other systems are negative.    ALLERGIES:  Patient has no known allergies.    MEDICATIONS:  Current Outpatient Medications   Medication Sig Dispense Refill   ??? darunavir-cobicistat (PREZCOBIX) 800-150 mg-mg tablet Take 1 tablet by mouth daily. 30 tablet 11   ??? emtricitabine-tenofovir, TDF, (TRUVADA) 200-300 mg  per tablet Take 1 tablet by mouth daily. 30 tablet 11   ??? hydroCHLOROthiazide (HYDRODIURIL) 25 MG tablet Take 1 tablet (25 mg total) by mouth daily. 90 tablet 10   ??? ibuprofen (ADVIL,MOTRIN) 200 MG tablet Take 400 mg by mouth every six (6) hours as needed for pain.     ??? sildenafiL (VIAGRA) 100 MG tablet Take 1 tablet (100 mg total) by mouth once as needed for erectile dysfunction for up to 1 dose. 1 tablet 6   ??? sodium,potassium,mag sulfates (SUPREP BOWEL PREP KIT) 17.5-3.13-1.6 gram SolR Take as directed instructions mailed to patient 1 Bottle 0     No current facility-administered medications for this visit.        PHYSICAL EXAM:  There were no vitals filed for this visit.  CONSTITUTIONAL: Alert,well appearing, no distress  HEENT: Moist mucous membranes, oropharynx clear without erythema or exudate  NECK: Supple, no lymphadenopathy  CARDIOVASCULAR: Regular, 3/6 SEM.   PULM: Clear to auscultation bilaterally  GASTROINTESTINAL: Soft, active bowel sounds, nontender  EXTREMITIES: No lower extremity edema bilaterally, dorsalis pedis pulses 2+ bilaterally   SKIN: No rashes or lesions  NEUROLOGIC: No focal motor or sensory deficits    DATA:    Results for orders placed or performed in visit on 07/08/18   HIV RNA, Quantitative, PCR   Result Value Ref Range    HIV RNA Quant Result Detected (A) Not Detected    HIV RNA <40 (H) <0 copies/mL    HIV RNA Log(10)      HIV RNA Comment     Lipid Panel   Result Value Ref Range    Triglycerides 119 1 - 149 mg/dL    Cholesterol 161 (H) 100 - 199 mg/dL    HDL 55 40 - 59 mg/dL    LDL Calculated 096 (H) 60 - 99 mg/dL    VLDL Cholesterol Cal 23.8 11 - 50 mg/dL    Chol/HDL Ratio 3.8 <0.4    Non-HDL Cholesterol 155 mg/dL    FASTING Yes    Hepatitis B DNA, PCR, Quantitative   Result Value Ref Range    HBV DNA Quant Not Detected Not Detected    HBV DNA Comment     Comprehensive Metabolic Panel   Result Value Ref Range    Sodium 140 135 - 145 mmol/L    Potassium 4.1 3.5 - 5.0 mmol/L    Chloride 96 (L) 98 - 107 mmol/L    Anion Gap 11 7 - 15 mmol/L    CO2 33.0 (H) 22.0 - 30.0 mmol/L    BUN 17 7 - 21 mg/dL    Creatinine 5.40 9.81 - 1.30 mg/dL    BUN/Creatinine Ratio 16     EGFR CKD-EPI Non-African American, Male 71 >=60 mL/min/1.43m2    EGFR CKD-EPI African American, Male >90 >=60 mL/min/1.49m2    Glucose 92 70 - 179 mg/dL    Calcium 9.6 8.5 - 19.1 mg/dL    Albumin 4.7 3.5 - 5.0 g/dL    Total Protein 8.5 (H) 6.5 - 8.3 g/dL    Total Bilirubin 0.6 0.0 - 1.2 mg/dL    AST 33 19 - 55 U/L    ALT 24 <50 U/L    Alkaline Phosphatase 63 38 - 126 U/L   LYMPH MARKER LIMITED,FLOW   Result Value Ref Range    CD3% (T Cells) 70 61 - 86 %    Absolute CD3 Count 1,441 915-3,400 /uL    CD4% (T Helper) 21 (L) 34 -  58 %    Absolute CD4 Count 432 (L) 510-2,320 /uL    CD8% T Suppressor 48 (H) 12 - 38 %    Absolute CD8 Count 988 180-1,520 /uL    CD4:CD8 Ratio 0.4 (L) 0.9 - 4.8   CBC w/ Differential   Result Value Ref Range    WBC 5.3 4.5 - 11.0 10*9/L    RBC 5.67 4.50 - 5.90 10*12/L    HGB 15.8 13.5 - 17.5 g/dL    HCT 16.1 09.6 - 04.5 %    MCV 86.9 80.0 - 100.0 fL    MCH 27.8 26.0 - 34.0 pg    MCHC 32.0 31.0 - 37.0 g/dL    RDW 40.9 81.1 - 91.4 %    MPV 7.9 7.0 - 10.0 fL    Platelet 259 150 - 440 10*9/L    Neutrophils % 51.8 %    Lymphocytes % 36.4 %    Monocytes % 6.4 %    Eosinophils % 1.3 %    Basophils % 0.9 %    Absolute Neutrophils 2.7 2.0 - 7.5 10*9/L    Absolute Lymphocytes 1.9 1.5 - 5.0 10*9/L    Absolute Monocytes 0.3 0.2 - 0.8 10*9/L    Absolute Eosinophils 0.1 0.0 - 0.4 10*9/L    Absolute Basophils 0.1 0.0 - 0.1 10*9/L    Large Unstained Cells 3 0 - 4 %        Creatinine   Date Value Ref Range Status   07/08/2018 1.06 0.70 - 1.30 mg/dL Final   78/29/5621 3.08 0.70 - 1.30 mg/dL Final   65/78/4696 2.95 0.70 - 1.30 mg/dL Final   28/41/3244 0.10 0.70 - 1.30 mg/dL Final   27/25/3664 4.03 0.80 - 1.30 mg/dL Final     Comment:     Interpretive Data: * This creatinine method is traceable to a GC-IDMS method and NIST standard reference material. (Entered 04/06/2011 17:09:22 By George Hugh       Triglycerides   Date Value Ref Range Status   07/08/2018 119 1 - 149 mg/dL Final     HDL   Date Value Ref Range Status   07/08/2018 55 40 - 59 mg/dL Final     LDL Calculated   Date Value Ref Range Status   07/08/2018 131 (H) 60 - 99 mg/dL Final     Comment:     NHLBI Recommended Ranges, LDL Cholesterol, for Adults (20+yrs) (ATPIII), mg/dL  Optimal              <474  Near Optimal        100-129  Borderline High     130-159  High                160-189  Very High            >=190  NHLBI Recommended Ranges, LDL Cholesterol, for Children (2-19 yrs), mg/dL  Desirable            <259  Borderline High     110-129  High                 >=130         IMAGING STUDIES:  None

## 2019-03-22 LAB — COMPREHENSIVE METABOLIC PANEL
A/G RATIO: 1.6 (ref 1.2–2.2)
ALBUMIN: 4.7 g/dL (ref 4.0–5.0)
ALKALINE PHOSPHATASE: 78 IU/L (ref 39–117)
ALT (SGPT): 12 IU/L (ref 0–44)
AST (SGOT): 18 IU/L (ref 0–40)
BILIRUBIN TOTAL: 0.6 mg/dL (ref 0.0–1.2)
BLOOD UREA NITROGEN: 16 mg/dL (ref 6–24)
CALCIUM: 9.7 mg/dL (ref 8.7–10.2)
CHLORIDE: 97 mmol/L (ref 96–106)
CO2: 28 mmol/L (ref 20–29)
CREATININE: 1.36 mg/dL — ABNORMAL HIGH (ref 0.76–1.27)
GFR MDRD AF AMER: 70 mL/min/{1.73_m2}
GFR MDRD NON AF AMER: 60 mL/min/{1.73_m2}
GLUCOSE: 90 mg/dL (ref 65–99)
POTASSIUM: 3.9 mmol/L (ref 3.5–5.2)
SODIUM: 140 mmol/L (ref 134–144)
TOTAL PROTEIN: 7.6 g/dL (ref 6.0–8.5)

## 2019-03-22 LAB — HIV RNA LOG(10)

## 2019-03-22 LAB — LIPID PANEL
CHOLESTEROL, TOTAL: 221 mg/dL — ABNORMAL HIGH (ref 100–199)
LDL CHOLESTEROL CALCULATED: 155 mg/dL — ABNORMAL HIGH (ref 0–99)
LDL/HDL RATIO: 3.8 ratio — ABNORMAL HIGH (ref 0.0–3.6)

## 2019-03-22 LAB — LYMPHOCYTE MARKERS LIMITED
% CD 3 POS. LYMPH.: 67.1 % (ref 57.5–86.2)
% NK (CD56/16): 21.3 % — ABNORMAL HIGH (ref 1.4–19.4)
AB NK (CD56): 469 /uL — ABNORMAL HIGH (ref 24–406)
ABSOLUTE CD8 CNT: 1008 /uL — ABNORMAL HIGH (ref 109–897)
BANDED NEUTROPHILS ABSOLUTE COUNT: 0 10*3/uL (ref 0.0–0.1)
BASOPHILS ABSOLUTE COUNT: 0 10*3/uL (ref 0.0–0.2)
BASOPHILS RELATIVE PERCENT: 1 %
CD4 T CELL ABSOLUTE: 427 /uL (ref 359–1519)
CD4:CD8 RATIO: 0.42 — ABNORMAL LOW (ref 0.92–3.72)
CD8 % SUPPRESSOR T CELL: 45.8 % — ABNORMAL HIGH (ref 12.0–35.5)
EOSINOPHILS ABSOLUTE COUNT: 0.1 10*3/uL (ref 0.0–0.4)
EOSINOPHILS RELATIVE PERCENT: 1 %
HEMATOCRIT: 47.7 % (ref 37.5–51.0)
HEMOGLOBIN: 15.6 g/dL (ref 13.0–17.7)
LYMPHOCYTES ABSOLUTE COUNT: 2.2 10*3/uL (ref 0.7–3.1)
LYMPHOCYTES RELATIVE PERCENT: 35 %
MEAN CORPUSCULAR HEMOGLOBIN CONC: 32.7 g/dL (ref 31.5–35.7)
MEAN CORPUSCULAR HEMOGLOBIN: 27.6 pg (ref 26.6–33.0)
MEAN CORPUSCULAR VOLUME: 84 fL (ref 79–97)
MONOCYTES ABSOLUTE COUNT: 0.6 10*3/uL (ref 0.1–0.9)
MONOCYTES RELATIVE PERCENT: 9 %
NEUTROPHILS RELATIVE PERCENT: 54 %
PLATELET COUNT: 244 10*3/uL (ref 150–450)
RED BLOOD CELL COUNT: 5.65 x10E6/uL (ref 4.14–5.80)
RED CELL DISTRIBUTION WIDTH: 12.8 % (ref 11.6–15.4)
WHITE BLOOD CELL COUNT: 6.4 10*3/uL (ref 3.4–10.8)

## 2019-03-22 LAB — % CD 3 POS. LYMPH.: Cells.CD3/100 cells:NFr:Pt:Bld:Qn:: 67.1

## 2019-03-22 LAB — POTASSIUM: Potassium:SCnc:Pt:Ser/Plas:Qn:: 3.9

## 2019-03-22 LAB — HEPATITIS B DNA, ULTRAQUANTITATIVE, PCR

## 2019-03-22 LAB — HIV RNA, QUANTITATIVE, PCR: HIV RNA: <40 {copies}/mL

## 2019-03-22 LAB — TRIGLYCERIDES: Triglyceride:MCnc:Pt:Ser/Plas:Qn:: 135

## 2019-03-22 LAB — HBV IU/ML: Hepatitis B virus DNA:ACnc:Pt:Ser/Plas:Qn:Probe.amp.tar: <10

## 2019-03-27 NOTE — Unmapped (Signed)
[facility] Refill Coordination Note    Specialty Medication(s) to be Shipped:   Infectious Disease: Prezcobix and Truvada    Other medication(s) to be shipped:       Gary Campbell, DOB: 16-Feb-1971  Phone: [phone] (home)       All above HIPAA information was verified with patient.     Completed refill call assessment today to schedule patient's medication shipment from the [facility] [phone]).       Specialty medication(s) and dose(s) confirmed: Regimen is correct and unchanged.   Changes to medications: Gary Campbell reports no changes at this time.  Changes to insurance: No  Questions for the pharmacist: No    Confirmed patient received Welcome Packet with first shipment. The patient will receive a drug information handout for each medication shipped and additional FDA Medication Guides as required.       DISEASE/MEDICATION-SPECIFIC INFORMATION        N/A    SPECIALTY MEDICATION ADHERENCE     Medication Adherence    Patient reported X missed doses in the last month: 0  Specialty Medication: truvada 200-300mg   Patient is on additional specialty medications: Yes  Additional Specialty Medications: prezcobix 800-150mg   Patient Reported Additional Medication X Missed Doses in the Last Month: 0  Patient is on more than two specialty medications: No  Informant: patient                Truvada 200-300 mg: 7 days of medicine on hand   Prezcobix 800-150 mg: 7 days of medicine on hand         SHIPPING     Shipping address confirmed in Epic.     Delivery Scheduled: Yes, Expected medication delivery date: 10/20.     Medication will be delivered via UPS to the home address in Epic WAM.    Gary Campbell   [facility] Specialty Technician

## 2019-03-31 MED FILL — TRUVADA 200 MG-300 MG TABLET: ORAL | 30 days supply | Qty: 30 | Fill #7

## 2019-03-31 MED FILL — PREZCOBIX 800 MG-150 MG TABLET: ORAL | 30 days supply | Qty: 30 | Fill #7

## 2019-03-31 MED FILL — PREZCOBIX 800 MG-150 MG TABLET: 30 days supply | Qty: 30 | Fill #7 | Status: AC

## 2019-03-31 MED FILL — TRUVADA 200 MG-300 MG TABLET: 30 days supply | Qty: 30 | Fill #7 | Status: AC

## 2019-04-24 NOTE — Unmapped (Signed)
[facility] Refill Coordination Note    Specialty Medication(s) to be Shipped:   Infectious Disease: Prezcobix and Truvada    Other medication(s) to be shipped: n/a     Gary Campbell, DOB: 1970-12-02  Phone: [phone] (home)       All above HIPAA information was verified with patient.     Completed refill call assessment today to schedule patient's medication shipment from the [facility] [phone]).       Specialty medication(s) and dose(s) confirmed: Regimen is correct and unchanged.   Changes to medications: Gary Campbell reports no changes at this time.  Changes to insurance: No  Questions for the pharmacist: No    Confirmed patient received Welcome Packet with first shipment. The patient will receive a drug information handout for each medication shipped and additional FDA Medication Guides as required.       DISEASE/MEDICATION-SPECIFIC INFORMATION        N/A    SPECIALTY MEDICATION ADHERENCE     Medication Adherence    Patient reported X missed doses in the last month: 0  Specialty Medication: Prezcobix  Patient is on additional specialty medications: Yes  Additional Specialty Medications: Truvada  Patient Reported Additional Medication X Missed Doses in the Last Month: 0                Truvada 200-300 mg: 7 days of medicine on hand   Prezcobix 800-150 mg: 7 days of medicine on hand       SHIPPING     Shipping address confirmed in Epic.     Delivery Scheduled: Yes, Expected medication delivery date: 11/17.     Medication will be delivered via UPS to the prescription address in Epic WAM.    Gary Campbell   [facility] Specialty Pharmacist

## 2019-04-28 DIAGNOSIS — B20 Human immunodeficiency virus [HIV] disease: Principal | ICD-10-CM

## 2019-04-28 MED FILL — PREZCOBIX 800 MG-150 MG TABLET: 30 days supply | Qty: 30 | Fill #8 | Status: AC

## 2019-04-28 MED FILL — PREZCOBIX 800 MG-150 MG TABLET: ORAL | 30 days supply | Qty: 30 | Fill #8

## 2019-04-28 NOTE — Unmapped (Signed)
Gary Campbell 's Truvada shipment will be delayed as a result of prior authorization being required by the patient's insurance.     I have reached out to the patient and communicated the delay. We will call the patient back to reschedule the delivery upon resolution. We have not confirmed the new delivery date.

## 2019-04-30 NOTE — Unmapped (Signed)
I spoke to [name] regarding the Truvada and Prezcobix and told him that it is very important that he takes both medications to avoid the HIV from becoming resistant and have inadequate coverage to suppress the virus.  He told me that he has a few days left of the Truvada and is waiting for a call back from his provider to discuss appealing the insurance company's decision to only pay for the generic.     Corliss Skains. [place], [place].D.  Specialty Pharmacist  [facility]  [phone] option 4

## 2019-05-02 NOTE — Unmapped (Signed)
[name] 's TRUVADA shipment will be canceled  as a result of prior authorization being required by the patient's insurance.     I have reached out to the patient and was unable to leave a message. We will not reschedule the medication due to PATIENT WANTS TO TALK TO HIS PROVIDER ABOUT SWITCHING TO GENERIC  and have removed this/these medication(s) from the work request.  We have canceled this work request.

## 2019-05-15 NOTE — Unmapped (Addendum)
I spoke to Mr. Cain Saupe who has 4-5 doses of Truvada and Prezcobix left. He denies disruptions in dosing at this time and has been taking his medications consistently. His medication delivery has been delayed due to his insurance issue. He prefer to switch back Biktarvy for the sake of simplicity. Will contact his provider for FYI and to request new rx for Biktarvy to be sent to the [facility] pharmacy.     Time Spent During Encounter: 15 minutes     Tonie Griffith, PharmD, BCPS, AAHIVP, CPP  Infectious Disease Clinical Pharmacy Practitioner   [facility]   Direct line: [phone]

## 2019-05-16 DIAGNOSIS — B20 Human immunodeficiency virus [HIV] disease: Principal | ICD-10-CM

## 2019-05-16 MED ORDER — BIKTARVY 50 MG-200 MG-25 MG TABLET
ORAL_TABLET | Freq: Every day | ORAL | 0 refills | 30.00000 days | Status: CP
Start: 2019-05-16 — End: 2019-05-16
  Filled 2019-05-16: qty 30, 30d supply, fill #0

## 2019-05-16 MED ORDER — BIKTARVY 50 MG-200 MG-25 MG TABLET: 1 | tablet | Freq: Every day | 0 refills | 30 days | Status: AC

## 2019-05-16 MED FILL — BIKTARVY 50 MG-200 MG-25 MG TABLET: 30 days supply | Qty: 30 | Fill #0 | Status: AC

## 2019-05-16 NOTE — Unmapped (Signed)
Provided fill for Biktarvy because the patient is running out of meds.     Time Spent During Encounter: 5 minutes     Tonie Griffith, PharmD, BCPS, AAHIVP, CPP  Infectious Disease Clinical Pharmacy Practitioner   [facility]   Direct line: [phone]

## 2019-05-16 NOTE — Unmapped (Signed)
[facility]   Patient Onboarding/Medication Counseling    Gary Campbell is a 48 y.o. male with HIV who I am counseling today on initiation of therapy.  I am speaking to the patient.    Was a Nurse, learning disability used for this call? No    Verified patient's date of birth / HIPAA.    Specialty medication(s) to be sent: Infectious Disease: Biktarvy      Non-specialty medications/supplies to be sent: n/a      Medications not needed at this time: n/a         Biktarvy (bictegravir, emtricitabine, and tenofovir alafenamide)    The patient declined counseling on medication administration, missed dose instructions, goals of therapy, side effects and monitoring parameters, warnings and precautions, drug/food interactions and storage, handling precautions, and disposal because they have taken the medication previously.  I told him that if he every had any questions or would like me to counsel him on Biktarvy to please call the pharmacy at [phone] option 4.  The information in the declined sections below are for informational purposes only and was not discussed with patient.       Medication & Administration     Dosage: Take 1 tablet by mouth daily    Administration: Take without regard to food    Adherence/Missed dose instructions: take missed dose as soon as you remember. If it is close to the time of your next dose, skip the dose and resume with your next scheduled dose.    Goals of Therapy     To suppress viral replication and keep patient's HIV undetectable by lab tests    Side Effects & Monitoring Parameters     Common Side Effects:  ? Diarrhea  ? Upset stomach  ? Headache  ? Changes in Weight  ? Changes in mood    The following side effects should be reported to the provider: ?? If patient experiences: signs of an allergic reaction (rash; hives; itching; red, swollen, blistered, or peeling skin with or without fever; wheezing; tightness in the chest or throat; trouble breathing, swallowing, or talking; unusual hoarseness; or swelling of the mouth, face, lips, tongue, or throat)  ?? signs of kidney problems (unable to pass urine, change in how much urine is passed, blood in the urine, or a big weight gain)  ?? signs of liver problems (dark urine, feeling tired, not hungry, upset stomach or stomach pain, light-colored stools, throwing up, or yellow skin or eyes)  ?? signs of lactic acidosis (fast breathing, fast heartbeat, a heartbeat that does not feel normal, very bad upset stomach or throwing up, feeling very sleepy, shortness of breath, feeling very tired or weak, very bad dizziness, feeling cold, or muscle pain or cramps)  Weight gain: some patients have reported weight gain after starting this medication. The amount of weight can vary.    Monitoring Parameters:  ?? CD4  Count  ?? HIV RNA plasma levels,  ?? Liver function  ?? Total bilirubin  ?? serum creatinine  ?? urine glucose  ?? urine protein (prior to or when initiating therapy and as clinically indicated during therapy);       Drug/Food Interactions     ? Medication list reviewed in Epic. The patient was instructed to inform the care team before taking any new medications or supplements. No drug interactions identified.   ? Calcium Salts: May decrease the serum concentration of Biktarvy. If taken with food, Biktarvy can be administered with calcium salts. If taken  on an empty stomach, separate doses by at least 2 hours.  ? Iron Preparations: May decrease the serum concentration of Biktarvy. If taken with food, Biktarvy can be administered with Ferrous sulfate. If taken on an empty stomach, separate doses by at least 2 hours. Avoid other iron salts     Contraindications, Warnings, & Precautions ? Black Box Warning: Severe acute exacertbations of HBV have been reported in patients coinfected with HIV-1 and HBV fllowing discontinuation of therapy  ? Coadministration with dofetilide, rifampin is contraindicated  ? Immune reconstitution syndrome: Patients may develop immune reconstitution syndrome, resulting in the occurrence of an inflammatory response to an indolent or residual opportunistic infection or activation of autoimmune disorders (eg, Graves disease, polymyositis, Guillain-Barr?? syndrome, autoimmune hepatitis)   ? Lactic acidosis/hepatomegaly  ? Renal toxicity: patients with preexisting renal impairment and those taking nephrotoxic agents (including NSAIDs) are at increased risk.     Storage, Handling Precautions, & Disposal     ? Store in the original container at room temperature.   ? Keep lid tightly closed.   ? Store in a dry place. Do not store in a bathroom.   ? Keep all drugs in a safe place. Keep all drugs out of the reach of children and pets.   ? Throw away unused or expired drugs. Do not flush down a toilet or pour down a drain unless you are told to do so. Check with your pharmacist if you have questions about the best way to throw out drugs. There may be drug take-back programs in your area.        Current Medications (including OTC/herbals), Comorbidities and Allergies     Current Outpatient Medications   Medication Sig Dispense Refill   ??? bictegrav-emtricit-tenofov ala (BIKTARVY) 50-200-25 mg tablet Take 1 tablet by mouth daily. 30 tablet 0   ??? hydroCHLOROthiazide (HYDRODIURIL) 25 MG tablet Take 1 tablet (25 mg total) by mouth daily. 90 tablet 10   ??? ibuprofen (ADVIL,MOTRIN) 200 MG tablet Take 400 mg by mouth every six (6) hours as needed for pain.     ??? sildenafiL (VIAGRA) 100 MG tablet Take 1 tablet (100 mg total) by mouth once as needed for erectile dysfunction for up to 1 dose. 1 tablet 6 ??? sodium,potassium,mag sulfates (SUPREP BOWEL PREP KIT) 17.5-3.13-1.6 gram SolR Take as directed instructions mailed to patient 1 Bottle 0     No current facility-administered medications for this visit.        No Known Allergies    Patient Active Problem List   Diagnosis   ??? HIV disease (CMS-HCC)   ??? HBV (hepatitis B virus) infection       Reviewed and up to date in Epic.    Appropriateness of Therapy     Is medication and dose appropriate based on diagnosis? Yes    Prescription has been clinically reviewed: Yes    Baseline Quality of Life Assessment      How many days over the past month did your HIV keep you from your normal activities? 0    Financial Information     Medication Assistance provided: Copay Assistance    Anticipated copay of $0.00 reviewed with patient. Verified delivery address.    Delivery Information     Scheduled delivery date: 05/19/2019    Expected start date: 12/070/2020    Medication will be delivered via UPS to the prescription address in [facility].  This shipment will not require a signature.      Explained  the services we provide at [facility] and that each month we would call to set up refills.  Stressed importance of returning phone calls so that we could ensure they receive their medications in time each month.  Informed patient that we should be setting up refills 7-10 days prior to when they will run out of medication.  A pharmacist will reach out to perform a clinical assessment periodically.  Informed patient that a welcome packet and a drug information handout will be sent.      Patient verbalized understanding of the above information as well as how to contact the pharmacy at [phone] option 4 with any questions/concerns.  The pharmacy is open Monday through Friday 8:30am-4:30pm.  A pharmacist is available 24/7 via pager to answer any clinical questions they may have.    Patient Specific Needs ? Does the patient have any physical, cognitive, or cultural barriers? No    ? Patient prefers to have medications discussed with  Patient     ? Is the patient or caregiver able to read and understand education materials at a high school level or above? Yes    ? Patient's primary language is  English     ? Is the patient high risk? No     ? Does the patient require a Care Management Plan? No     ? Does the patient require physician intervention or other additional services (i.e. nutrition, smoking cessation, social work)? No      Roderic Palau  [facility] Specialty Pharmacist

## 2019-06-11 DIAGNOSIS — B20 Human immunodeficiency virus [HIV] disease: Principal | ICD-10-CM

## 2019-06-11 MED ORDER — BIKTARVY 50 MG-200 MG-25 MG TABLET
ORAL_TABLET | Freq: Every day | ORAL | 0 refills | 30 days | Status: CP
Start: 2019-06-11 — End: ?
  Filled 2019-06-11: qty 30, 30d supply, fill #0

## 2019-06-11 MED FILL — BIKTARVY 50 MG-200 MG-25 MG TABLET: 30 days supply | Qty: 30 | Fill #0 | Status: AC

## 2019-06-11 NOTE — Unmapped (Signed)
[facility] Refill Coordination Note    Specialty Medication(s) to be Shipped:   Infectious Disease: Biktarvy    Other medication(s) to be shipped: N/A     [name], DOB: [birth year]  Phone: [phone] (home)       All above HIPAA information was verified with patient.     Was a Nurse, learning disability used for this call? No    Completed refill call assessment today to schedule patient's medication shipment from the [facility] [phone]).       Specialty medication(s) and dose(s) confirmed: Regimen is correct and unchanged.   Changes to medications: [name] reports no changes at this time.  Changes to insurance: No  Questions for the pharmacist: No    Confirmed patient received Welcome Packet with first shipment. The patient will receive a drug information handout for each medication shipped and additional FDA Medication Guides as required.       DISEASE/MEDICATION-SPECIFIC INFORMATION        N/A    SPECIALTY MEDICATION ADHERENCE     Medication Adherence    Patient reported X missed doses in the last month: 0  Specialty Medication: biktarvy                biktarvy  : 5 days of medicine on hand , not taken yet today        SHIPPING     Shipping address confirmed in Epic.     Delivery Scheduled: Yes, Expected medication delivery date: 12/31.     Medication will be delivered via UPS to the prescription address in Epic WAM.    [name]   [facility] Specialty Technician

## 2019-07-08 DIAGNOSIS — B20 Human immunodeficiency virus [HIV] disease: Principal | ICD-10-CM

## 2019-07-08 NOTE — Unmapped (Signed)
[facility] Refill Coordination Note    Specialty Medication(s) to be Shipped:   Infectious Disease: Biktarvy    Other medication(s) to be shipped: n/a     Gary Campbell, DOB: 1970-10-13  Phone: [phone] (home)       All above HIPAA information was verified with patient.     Completed refill call assessment today to schedule patient's medication shipment from the [facility] [phone]).       Specialty medication(s) and dose(s) confirmed: Regimen is correct and unchanged.   Changes to medications: Gary Campbell reports no changes reported at this time.  Changes to insurance: No  Questions for the pharmacist: No    Confirmed patient received Welcome Packet with first shipment. The patient will receive a drug information handout for each medication shipped and additional FDA Medication Guides as required.       DISEASE/MEDICATION-SPECIFIC INFORMATION        N/A    SPECIALTY MEDICATION ADHERENCE     Medication Adherence    Patient reported X missed doses in the last month: 0  Specialty Medication: Biktarvy 50-200-25  Patient is on additional specialty medications: No  Informant: patient  Reliability of informant: reliable          Biktarvy 50-200-25 mg: 4 days of medicine on hand         SHIPPING     Shipping address confirmed in Epic.     Delivery Scheduled: Yes, Expected medication delivery date: 07/11/19.  However, Rx request for refills was sent to the provider as there are none remaining.     Medication will be delivered via UPS to the prescription address in Epic WAM.    Gary Campbell Vangie Bicker   [facility] Specialty Pharmacist

## 2019-07-10 DIAGNOSIS — B20 Human immunodeficiency virus [HIV] disease: Principal | ICD-10-CM

## 2019-07-10 NOTE — Unmapped (Signed)
[name] 's Biktarvy shipment will be delayed as a result of no refills remain on the prescription.      I have reached out to the patient and communicated the delay. We will call the patient back to reschedule the delivery upon resolution. We have not confirmed the new delivery date.

## 2019-07-14 MED ORDER — BIKTARVY 50 MG-200 MG-25 MG TABLET
ORAL_TABLET | Freq: Every day | ORAL | 0 refills | 30.00000 days | Status: CP
Start: 2019-07-14 — End: ?
  Filled 2019-07-14: qty 30, 30d supply, fill #0

## 2019-07-14 MED FILL — BIKTARVY 50 MG-200 MG-25 MG TABLET: 30 days supply | Qty: 30 | Fill #0 | Status: AC

## 2019-07-14 NOTE — Unmapped (Signed)
Gary Campbell's Biktarvy shipment will be delayed as a result of no refills remaining on the prescription.    Patient called back to scheduled delivery.  New delivery date is 07/15/19.

## 2019-08-04 DIAGNOSIS — B20 Human immunodeficiency virus [HIV] disease: Principal | ICD-10-CM

## 2019-08-04 MED ORDER — BIKTARVY 50 MG-200 MG-25 MG TABLET
ORAL_TABLET | Freq: Every day | ORAL | 0 refills | 30.00000 days | Status: CP
Start: 2019-08-04 — End: ?
  Filled 2019-08-06: qty 30, 30d supply, fill #0

## 2019-08-04 NOTE — Unmapped (Signed)
[facility] Refill Coordination Note    Specialty Medication(s) to be Shipped:   Infectious Disease: Biktarvy    Other medication(s) to be shipped: n/a     [name], DOB: 1970-08-25  Phone: [phone] (home)       All above HIPAA information was verified with patient.     Was a Nurse, learning disability used for this call? No    Completed refill call assessment today to schedule patient's medication shipment from the [facility] [phone]).       Specialty medication(s) and dose(s) confirmed: Regimen is correct and unchanged.   Changes to medications: [name] reports no changes at this time.  Changes to insurance: No  Questions for the pharmacist: No    Confirmed patient received Welcome Packet with first shipment. The patient will receive a drug information handout for each medication shipped and additional FDA Medication Guides as required.       DISEASE/MEDICATION-SPECIFIC INFORMATION        N/A    SPECIALTY MEDICATION ADHERENCE     Medication Adherence    Patient reported X missed doses in the last month: 0  Specialty Medication: biktarvy            Unable to confirm quantity on hand.      SHIPPING     Shipping address confirmed in Epic.     Delivery Scheduled: Yes, Expected medication delivery date: 2/25.  However, Rx request for refills was sent to the provider as there are none remaining.     Medication will be delivered via UPS to the prescription address in Epic WAM.    [name]   [facility] Specialty Technician

## 2019-08-06 MED FILL — BIKTARVY 50 MG-200 MG-25 MG TABLET: 30 days supply | Qty: 30 | Fill #0 | Status: AC

## 2019-08-09 IMAGING — CR DG CHEST 2V
1 series · 2 of 2 positions shown · non-contrast
Comparison: None in PACs

CLINICAL DATA: Onset of chest pain described is tightness and
pressure sensation with exertional shortness of breath between 7 and
8 a.m.. History of HIV, hepatitis B, hypertension.

EXAM:
CHEST - 2 VIEW

[Series 1: dg chest 2 view · 0.14mm/px · 2 of 2 slices shown]
[im 1/2]
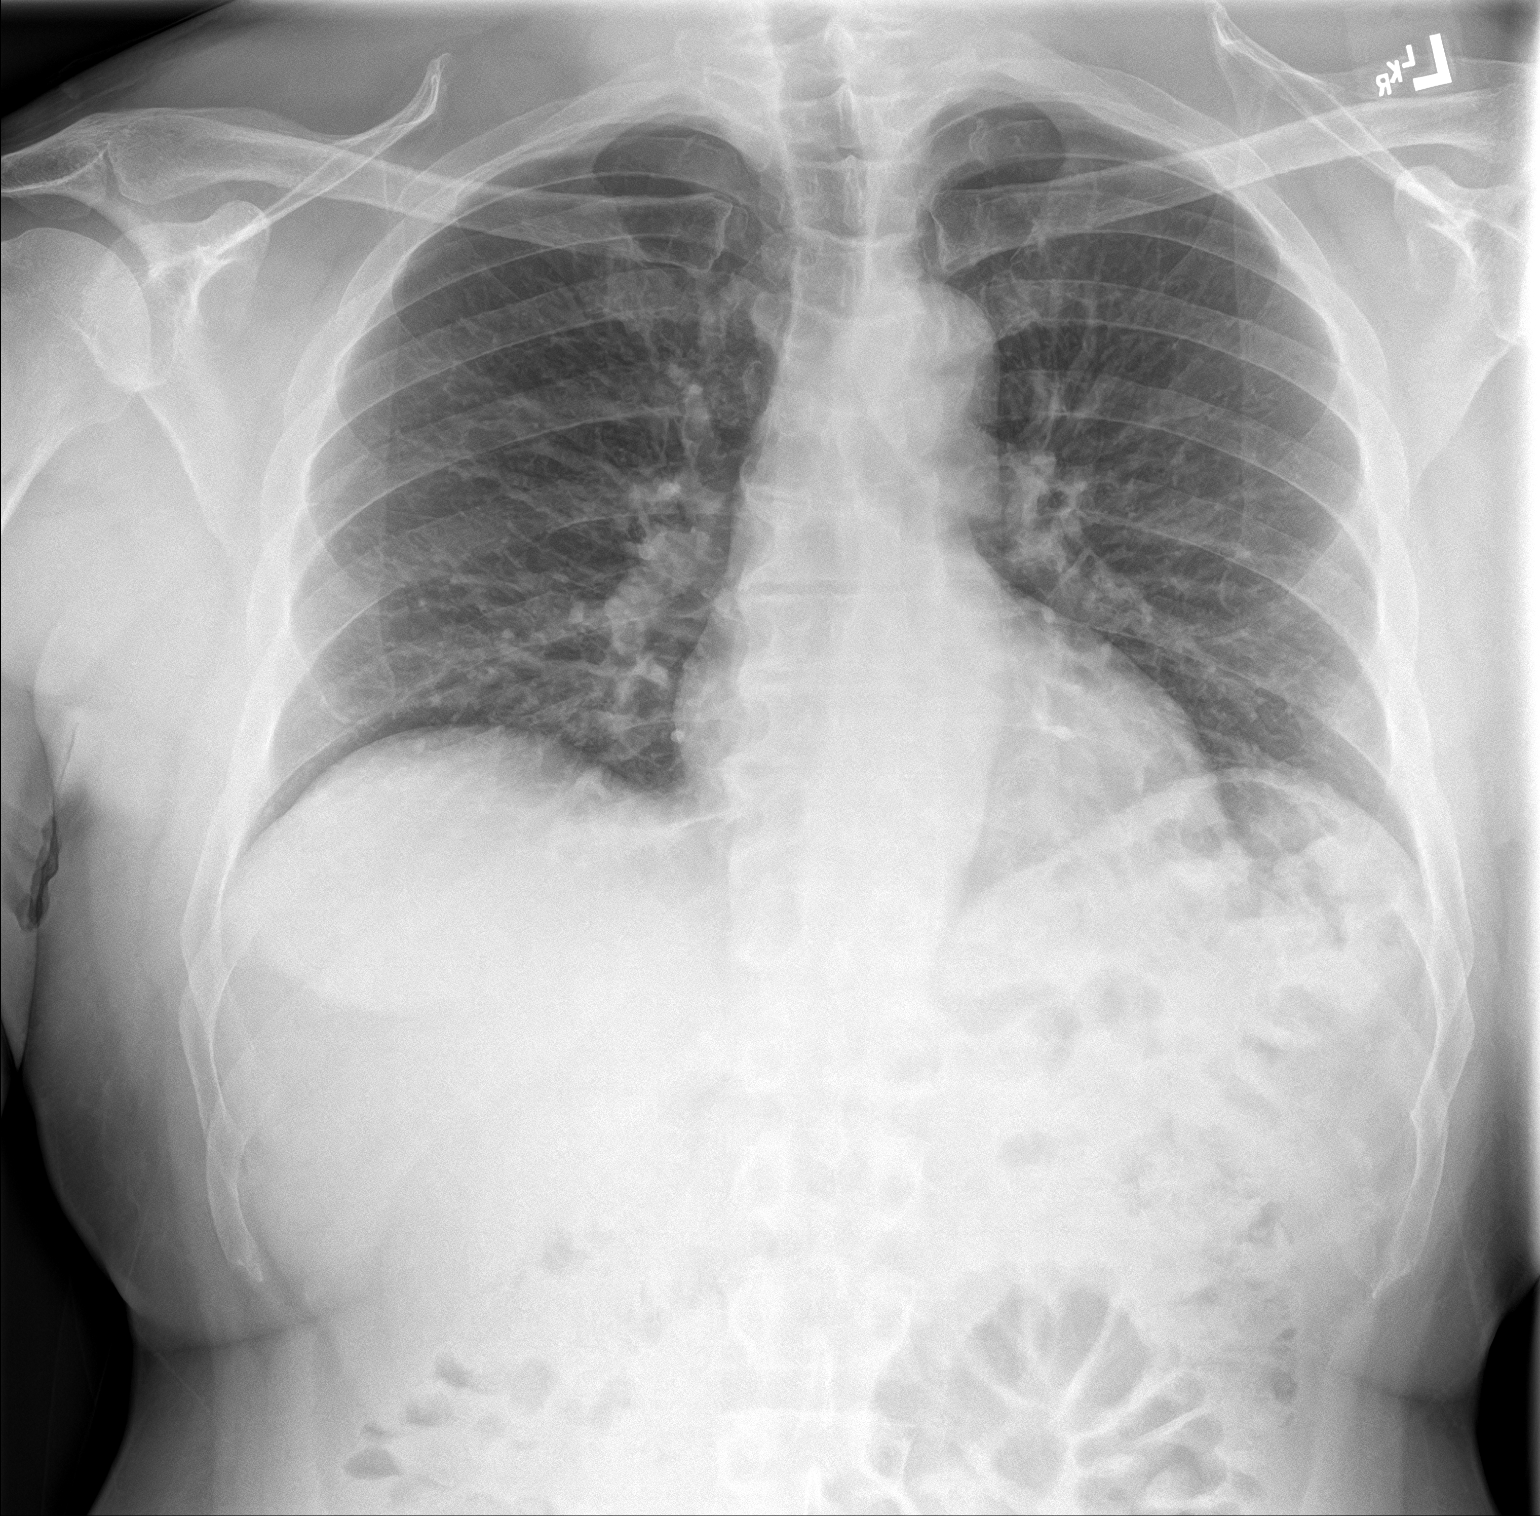
[im 2/2]
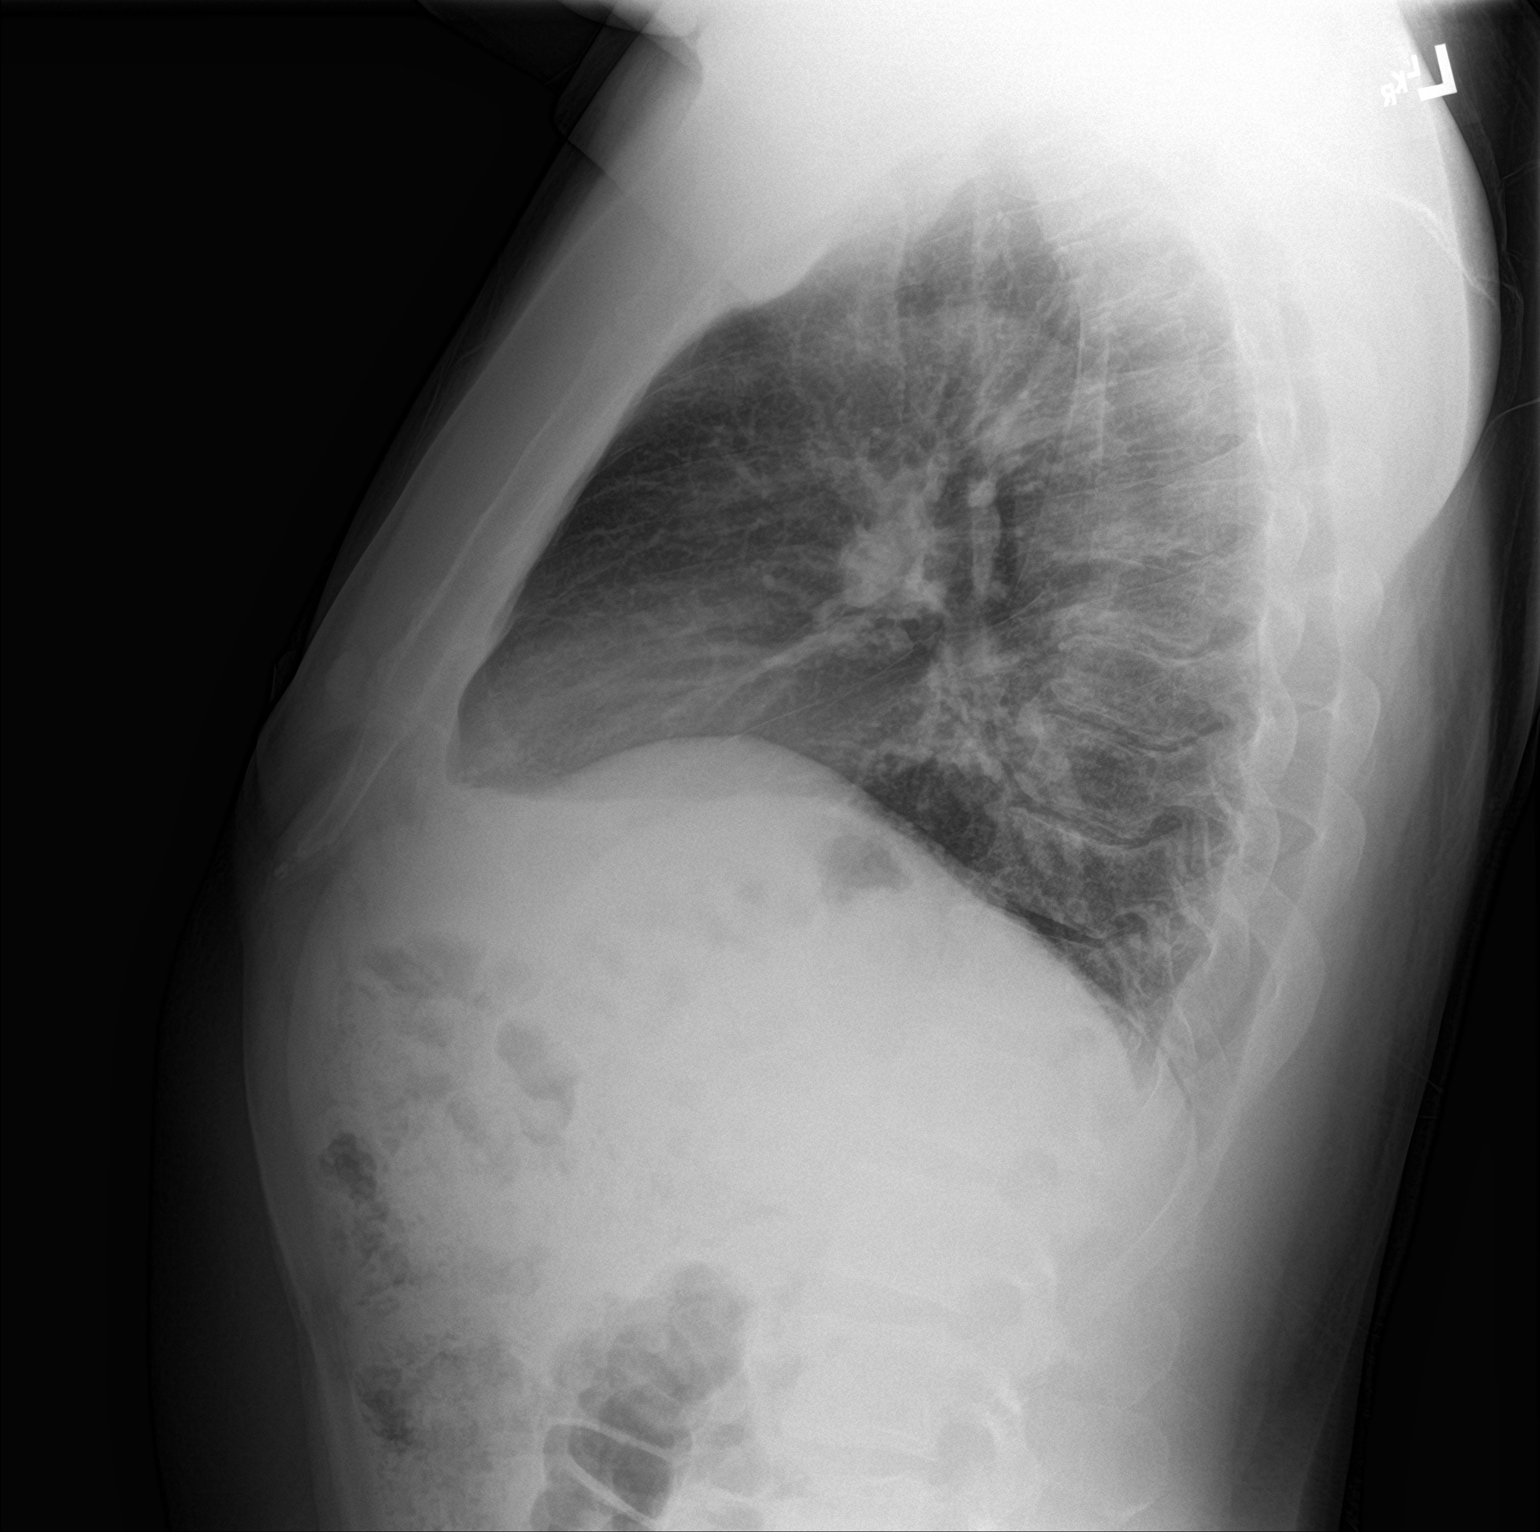

[2 of 2 positions shown; findings below may reference images not displayed]

FINDINGS: The lungs are mildly hypoinflated. There is no focal infiltrate.
There is no pleural effusion or pneumothorax. The heart and
pulmonary vascularity are normal. There is mild multilevel
degenerative disc space narrowing of the thoracic spine.
IMPRESSION: Somewhat limited study due to hypoinflation. No pneumonia nor other
acute cardiopulmonary abnormality.

## 2019-08-29 DIAGNOSIS — B20 Human immunodeficiency virus [HIV] disease: Principal | ICD-10-CM

## 2019-08-29 NOTE — Unmapped (Signed)
[facility] Refill Coordination Note    Specialty Medication(s) to be Shipped:   Infectious Disease: Biktarvy    Other medication(s) to be shipped: n/a       [name], DOB: [birth year]  Phone: [phone] (home)       All above HIPAA information was verified with patient.     Was a Nurse, learning disability used for this call? No    Completed refill call assessment today to schedule patient's medication shipment from the [facility] [phone]).       Specialty medication(s) and dose(s) confirmed: Regimen is correct and unchanged.   Changes to medications: [name] reports no changes at this time.  Changes to insurance: No  Questions for the pharmacist: No    Confirmed patient received Welcome Packet with first shipment. The patient will receive a drug information handout for each medication shipped and additional FDA Medication Guides as required.       DISEASE/MEDICATION-SPECIFIC INFORMATION        N/A    SPECIALTY MEDICATION ADHERENCE     Medication Adherence    Patient reported X missed doses in the last month: 0  Specialty Medication: biktarvy                biktarvy  : 7 days of medicine on hand         SHIPPING     Shipping address confirmed in Epic.     Delivery Scheduled: Yes, Expected medication delivery date: 3/24.  However, Rx request for refills was sent to the provider as there are none remaining.     Medication will be delivered via UPS to the prescription address in Epic WAM.    [name]   [facility] Specialty Technician

## 2019-08-31 MED ORDER — BIKTARVY 50 MG-200 MG-25 MG TABLET
ORAL_TABLET | Freq: Every day | ORAL | 11 refills | 30.00000 days | Status: CP
Start: 2019-08-31 — End: ?
  Filled 2019-09-02: qty 30, 30d supply, fill #0

## 2019-09-02 MED ORDER — HYDROCHLOROTHIAZIDE 25 MG TABLET
ORAL_TABLET | Freq: Every day | ORAL | 10 refills | 90.00000 days | Status: CP
Start: 2019-09-02 — End: ?
  Filled 2019-09-03: qty 90, 90d supply, fill #0

## 2019-09-02 MED FILL — BIKTARVY 50 MG-200 MG-25 MG TABLET: 30 days supply | Qty: 30 | Fill #0 | Status: AC

## 2019-09-03 MED FILL — HYDROCHLOROTHIAZIDE 25 MG TABLET: 90 days supply | Qty: 90 | Fill #0 | Status: AC

## 2019-09-25 NOTE — Unmapped (Signed)
Med Refill: Viagra 100mg   Last visit: 04/06/2019

## 2019-09-29 ENCOUNTER — Ambulatory Visit
Admit: 2019-09-29 | Discharge: 2019-09-30 | Payer: PRIVATE HEALTH INSURANCE | Attending: Infectious Disease | Primary: Infectious Disease

## 2019-09-29 DIAGNOSIS — Z113 Encounter for screening for infections with a predominantly sexual mode of transmission: Principal | ICD-10-CM

## 2019-09-29 DIAGNOSIS — B20 Human immunodeficiency virus [HIV] disease: Principal | ICD-10-CM

## 2019-09-29 DIAGNOSIS — B181 Chronic viral hepatitis B without delta-agent: Principal | ICD-10-CM

## 2019-09-29 LAB — COMPREHENSIVE METABOLIC PANEL
ALBUMIN: 4.4 g/dL (ref 3.4–5.0)
ALT (SGPT): 26 U/L (ref 10–49)
ANION GAP: 4 mmol/L (ref 3–11)
AST (SGOT): 24 U/L (ref <34)
BILIRUBIN TOTAL: 0.9 mg/dL (ref 0.3–1.2)
BLOOD UREA NITROGEN: 12 mg/dL (ref 9–23)
BUN / CREAT RATIO: 12
CALCIUM: 10 mg/dL (ref 8.7–10.4)
CHLORIDE: 101 mmol/L (ref 98–107)
CO2: 32.1 mmol/L — ABNORMAL HIGH (ref 20.0–31.0)
CREATININE: 1.02 mg/dL (ref 0.60–1.10)
EGFR CKD-EPI AA MALE: >90 mL/min/{1.73_m2}
EGFR CKD-EPI NON-AA MALE: 85 mL/min/{1.73_m2}
GLUCOSE RANDOM: 97 mg/dL (ref 70–179)
POTASSIUM: 3.4 mmol/L — ABNORMAL LOW (ref 3.5–5.1)
SODIUM: 137 mmol/L (ref 135–145)

## 2019-09-29 LAB — CBC W/ AUTO DIFF
BASOPHILS ABSOLUTE COUNT: 0.1 10*9/L (ref 0.0–0.1)
BASOPHILS RELATIVE PERCENT: 1 %
EOSINOPHILS ABSOLUTE COUNT: 0 10*9/L (ref 0.0–0.7)
EOSINOPHILS RELATIVE PERCENT: 0.8 %
HEMATOCRIT: 46.7 % (ref 38.0–50.0)
HEMOGLOBIN: 15.4 g/dL (ref 13.5–17.5)
LYMPHOCYTES ABSOLUTE COUNT: 1.5 10*9/L (ref 0.7–4.0)
LYMPHOCYTES RELATIVE PERCENT: 28.3 %
MEAN CORPUSCULAR HEMOGLOBIN CONC: 32.9 g/dL (ref 30.0–36.0)
MEAN CORPUSCULAR HEMOGLOBIN: 27.5 pg (ref 26.0–34.0)
MEAN CORPUSCULAR VOLUME: 83.5 fL (ref 81.0–95.0)
MEAN PLATELET VOLUME: 7.4 fL (ref 7.0–10.0)
MONOCYTES ABSOLUTE COUNT: 0.4 10*9/L (ref 0.1–1.0)
MONOCYTES RELATIVE PERCENT: 8.7 %
NEUTROPHILS ABSOLUTE COUNT: 3.2 10*9/L (ref 1.7–7.7)
NEUTROPHILS RELATIVE PERCENT: 61.2 %
NUCLEATED RED BLOOD CELLS: 0 /100{WBCs} (ref <=4)
PLATELET COUNT: 263 10*9/L (ref 150–450)
RED CELL DISTRIBUTION WIDTH: 13 % (ref 12.0–15.0)
WBC ADJUSTED: 5.2 10*9/L (ref 3.5–10.5)

## 2019-09-29 LAB — RED CELL DISTRIBUTION WIDTH: Lab: 13

## 2019-09-29 LAB — LIPID PANEL
CHOLESTEROL/HDL RATIO SCREEN: 4.9 — ABNORMAL HIGH (ref 1.0–4.5)
CHOLESTEROL: 235 mg/dL — ABNORMAL HIGH (ref <=200)
HDL CHOLESTEROL: 48 mg/dL (ref 40–60)
LDL CHOLESTEROL CALCULATED: 161 mg/dL — ABNORMAL HIGH (ref 40–100)
NON-HDL CHOLESTEROL: 187 mg/dL — ABNORMAL HIGH (ref 70–130)
TRIGLYCERIDES: 129 mg/dL (ref 0–150)

## 2019-09-29 LAB — NON-HDL CHOLESTEROL: Cholesterol.non HDL:MCnc:Pt:Ser/Plas:Qn:: 187 — ABNORMAL HIGH

## 2019-09-29 LAB — EGFR CKD-EPI AA MALE: Glomerular filtration rate/1.73 sq M.predicted.black:ArVRat:Pt:Ser/Plas/Bld:Qn:Creatinine-based formula (CKD-EPI): >90

## 2019-09-29 MED ORDER — SILDENAFIL 100 MG TABLET
ORAL_TABLET | Freq: Once | ORAL | 6 refills | 1 days | Status: CP | PRN
Start: 2019-09-29 — End: ?

## 2019-09-29 NOTE — Unmapped (Signed)
PCP:  Gary Sniff, MD    09/29/2019    This is a RETURN visit for this 49 y.o. male with the following diagnoses:    Patient Active Problem List    Diagnosis Date Noted   ??? HIV disease (CMS-HCC) 08/06/2017   ??? HBV (hepatitis B virus) infection 08/06/2017       ASSESSMENT/PLAN:    HIV  ?? Dx'd in 1989.   ?? Was cared for here until ~2003. Most recently cared for at [facility] by Matilde Haymaker  ?? Was on Viread+Tivicay+Prezcobix when switched over from [facility] to [facility]  ?? On transfer had HIV RNA <20c/mL  ?? Has HBV co-infection  ?? Documented K103N and 184V  ?? I changed him to [facility] which he started May 2019  ?? HIV RNA <40 c/mL since  ?? Wt has gone up since switch by 10 lbs. He thinks may be diet and exercise related but could be his HIV med as has been assoc w wt gain.  ?? PLAN:  ?? Recheck labs   ?? Watch weight. He was counseled re potential med effects and he will try to diet to see if this has impact   ?? Could change to TAF/FTC + DOR probably    HTN  ?? On HCTZ 25 mg daily  ?? Some ED since starting (lost am erections)  ?? Does not want to change his BP med  ?? Viagra helping  ?? BP high today even on my retesting after 15 min rest.   ?? He will check at home.  ?? Suspect will need to add ACEi    HBV Surface Ag+; e Antigen+; DNA un-detectable  ?? Pre-core mutant   ?? On Biktarvy which contains TAF and FTC   ?? HBV DNA repeatedly undetectable   ?? Needs q6 month ultrasounds. Last was in July 2019 and was neg for [facility]. Will order another   ?? Recheck labs    Insomnia  ?? Good sleep hygiene. Only 1 cup am coffee and no other caffeine   ?? Started before Tivicay and after partner's death  ?? Issue is falling asleep  ?? Melatonin OTC did not work  ?? Sleeps ~4 hours per night. Trouble falling asleep.  ?? BUT, says he feels rested in am.   ?? He may be someone who needs relatively little sleep.     Sexual Health  ?? MSM  ?? Stable partner who lives in [place]. He visited [place] last month.  ?? Partner may have other partners.   ?? Says not bottom.   ?? Check GC/Ch throat and urine and RPR.    Mental Health  ?? Stable and good    Health Maintenance   ?? Immunizations: Had flu shot. Get records from [facility]. Got C19 vaccine x 2.  ?? Cancer Screening: Family hx of early colon cancer. Has had colonoscopy and due for repeat. Will order for end of this year. US Liver.   ?? Lipids: High LDL-C. Chekcing again today and if still will start statin. He is aware.  ?? Other:   ?? Had gastric sleeve 3 years ago. Has kept wt off. Not on PPIs.  ?? Follow BP  ?? No meds other than ART and HCTZ  ?? Has 3/6 systolic murmur - known and has had echocardiogram he said without concerns.     Will follow up in 6 months.       Chief Complaint: HIV follow-up    HPI:  Mr. Gary Needle  Campbell returns for an appointment.       ROS:   No fever chills, sweats, nausea, vomiting, diarrhea, rash, joint pain. Pos for  insomnia (chronic)    All other systems are negative.    ALLERGIES:  Patient has no known allergies.    MEDICATIONS:  Current Outpatient Medications   Medication Sig Dispense Refill   ??? bictegrav-emtricit-tenofov ala (BIKTARVY) 50-200-25 mg tablet Take 1 tablet by mouth daily. 30 tablet 11   ??? hydroCHLOROthiazide (HYDRODIURIL) 25 MG tablet Take 1 tablet (25 mg total) by mouth daily. 90 tablet 10   ??? ibuprofen (ADVIL,MOTRIN) 200 MG tablet Take 400 mg by mouth every six (6) hours as needed for pain.     ??? sildenafiL (VIAGRA) 100 MG tablet Take 1 tablet (100 mg total) by mouth once as needed for erectile dysfunction for up to 1 dose. 1 tablet 6   ??? sodium,potassium,mag sulfates (SUPREP BOWEL PREP KIT) 17.5-3.13-1.6 gram SolR Take as directed instructions mailed to patient (Patient not taking: Reported on 09/29/2019) 1 Bottle 0     No current facility-administered medications for this visit.       PHYSICAL EXAM:  Vitals:    09/29/19 0927   BP: 151/100   Pulse: 56   Temp: 36.6 ??C (97.9 ??F)     CONSTITUTIONAL: Alert,well appearing, no distress  HEENT: Moist mucous membranes, oropharynx clear without erythema or exudate NECK: Supple, no lymphadenopathy  CARDIOVASCULAR: Regular, 3/6 SEM.   PULM: Clear to auscultation bilaterally  GASTROINTESTINAL: Soft, active bowel sounds, nontender  EXTREMITIES: No lower extremity edema bilaterally, dorsalis pedis pulses 2+ bilaterally   SKIN: No rashes or lesions  NEUROLOGIC: No focal motor or sensory deficits    DATA:    Results for orders placed or performed in visit on 03/17/19   Comprehensive Metabolic Panel   Result Value Ref Range    Glucose 90 65 - 99 mg/dL    BUN 16 6 - 24 mg/dL    Creatinine 1.61 (H) 0.76 - 1.27 mg/dL    GFR MDRD Non Af Amer 60 >59 mL/min/1.73    GFR MDRD Af Amer 70 >59 mL/min/1.73    BUN/Creatinine Ratio 12 9 - 20    Sodium 140 134 - 144 mmol/L    Potassium 3.9 3.5 - 5.2 mmol/L    Chloride 97 96 - 106 mmol/L    CO2 28 20 - 29 mmol/L    Calcium 9.7 8.7 - 10.2 mg/dL    Total Protein 7.6 6.0 - 8.5 g/dL    Albumin 4.7 4.0 - 5.0 g/dL    Globulin, Total 2.9 1.5 - 4.5 g/dL    A/G Ratio 1.6 1.2 - 2.2    Total Bilirubin 0.6 0.0 - 1.2 mg/dL    Alkaline Phosphatase 78 39 - 117 IU/L    AST 18 0 - 40 IU/L    ALT 12 0 - 44 IU/L   HIV RNA, Quantitative, PCR   Result Value Ref Range    HIV RNA <40 copies/mL    HIV RNA Log(10) CANCELED log10copy/mL   Lipid Panel   Result Value Ref Range    Cholesterol, Total 221 (H) 100 - 199 mg/dL    Triglycerides 096 0.45 - 149.00 mg/dL    HDL 41 >40 mg/dL    VLDL Cholesterol Cal 25 5 - 40 mg/dL    LDL Calculated 981 (H) 0 - 99 mg/dL    LDl/HDL Ratio 3.8 (H) 0.0 - 3.6 ratio   Lymphocyte Markers Limited  Result Value Ref Range    % NK (CD56/16) 21.3 (H) 1.4 - 19.4 %    Ab NK (CD56) 469 (H) 24 - 406 /uL    Absolute CD 3 1,476 622 - 2,402 /uL    CD4 T Cell Abs 427 359 - 1,519 /uL    Absolute CD8 Count 1,008 (H) 109 - 897 /uL    % CD 3 Pos. Lymph. 67.1 57.5 - 86.2 %    CD4 % Helper T Cell 19.4 (L) 30.8 - 58.5 %    CD8 % Suppressor T Cell 45.8 (H) 12.0 - 35.5 %    CD4:CD8 Ratio 0.42 (L) 0.9 - 3.7    WBC 6.4 3.4 - 10.8 x10E3/uL    RBC 5.65 4.14 - 5.80 x10E6/uL    HGB 15.6 13.0 - 17.7 g/dL    HCT 16.1 09.6 - 04.5 %    MCV 84 79.0 - 97.0 fL    MCH 27.6 26.6 - 33.0 pg    MCHC 32.7 31.5 - 35.7 g/dL    RDW 40.9 81.1 - 91.4 %    Platelet 244 150 - 450 x10E3/uL    Neutrophils % 54 Not Estab. %    Lymphocytes % 35 Not Estab. %    Monocytes % 9 Not Estab. %    Eosinophils % 1 Not Estab. %    Basophils % 1 Not Estab. %    Absolute Neutrophils 3.5 1.4 - 7.0 x10E3/uL    Absolute Lymphocytes 2.2 0.7 - 3.1 x10E3/uL    Absolute Monocytes  0.6 0.1 - 0.9 x10E3/uL    Absolute Eosinophils 0.1 0.0 - 0.4 x10E3/uL    Absolute Basophils  0.0 0.0 - 0.2 x10E3/uL    Immature Granulocytes 0 Not Estab. %    Bands Absolute 0.0 0 - 0 x10E3/uL   Hepatitis B DNA, PCR, Quantitative   Result Value Ref Range    HBV IU/mL <10 IU/mL    log10 HBV as IU/mL CANCELED log10 IU/mL    TEST INFORMATION: Comment         Creatinine   Date Value Ref Range Status   03/18/2019 1.36 (H) 0.76 - 1.27 mg/dL Final   78/29/5621 3.08 0.70 - 1.30 mg/dL Final   65/78/4696 2.95 0.70 - 1.30 mg/dL Final   28/41/3244 0.10 0.70 - 1.30 mg/dL Final   27/25/3664 4.03 0.70 - 1.30 mg/dL Final   47/42/5956 3.87 0.80 - 1.30 mg/dL Final     Comment:     Interpretive Data: * This creatinine method is traceable to a GC-IDMS method and NIST standard reference material. (Entered 04/06/2011 17:09:22 By George Hugh       Triglycerides   Date Value Ref Range Status   03/18/2019 135 0.00 - 149.00 mg/dL Final     HDL   Date Value Ref Range Status   03/18/2019 41 >39 mg/dL Final     LDL Calculated   Date Value Ref Range Status   03/18/2019 155 (H) 0 - 99 mg/dL Final       IMAGING STUDIES:  None

## 2019-09-30 LAB — HEPATITIS B SURFACE ANTIBODY: Hepatitis B virus surface Ab:PrThr:Pt:Ser:Ord:: NONREACTIVE

## 2019-09-30 LAB — HEPATITIS B SURFACE ANTIGEN

## 2019-09-30 LAB — HIV RNA LOG(10): Lab: 0

## 2019-09-30 LAB — HBSAG NEUT: Hepatitis B virus surface Ag:PrThr:Pt:Ser/Plas:Ord:Neut: POSITIVE — AB

## 2019-09-30 LAB — HIV RNA, QUANTITATIVE, PCR: HIV RNA: <40 {copies}/mL — ABNORMAL HIGH (ref <0)

## 2019-09-30 LAB — SYPHILIS RPR SCREEN: Reagin Ab:PrThr:Pt:Ser:Ord:RPR: NONREACTIVE

## 2019-09-30 LAB — HEPATITIS C ANTIBODY: Hepatitis C virus Ab:PrThr:Pt:Ser:Ord:: NONREACTIVE

## 2019-10-02 NOTE — Unmapped (Signed)
[facility] Clinical Assessment & Refill Coordination Note    [name], DOB: 29-Jun-1970  Phone: [phone] (home)     All above HIPAA information was verified with patient.     Was a Nurse, learning disability used for this call? No    Specialty Medication(s):   Infectious Disease: Biktarvy     Current Outpatient Medications   Medication Sig Dispense Refill   ??? bictegrav-emtricit-tenofov ala (BIKTARVY) 50-200-25 mg tablet Take 1 tablet by mouth daily. 30 tablet 11   ??? hydroCHLOROthiazide (HYDRODIURIL) 25 MG tablet Take 1 tablet (25 mg total) by mouth daily. 90 tablet 10   ??? ibuprofen (ADVIL,MOTRIN) 200 MG tablet Take 400 mg by mouth every six (6) hours as needed for pain.     ??? sildenafiL (VIAGRA) 100 MG tablet Take 1 tablet (100 mg total) by mouth once as needed for erectile dysfunction for up to 1 dose. 1 tablet 6   ??? sodium,potassium,mag sulfates (SUPREP BOWEL PREP KIT) 17.5-3.13-1.6 gram SolR Take as directed instructions mailed to patient (Patient not taking: Reported on 09/29/2019) 1 Bottle 0     No current facility-administered medications for this visit.        Changes to medications: [name] reports no changes at this time.    No Known Allergies    Changes to allergies: No    SPECIALTY MEDICATION ADHERENCE     Biktarvy   : 10 days of medicine on hand     Medication Adherence    Patient reported X missed doses in the last month: 0  Specialty Medication: Biktarvy  Patient is on additional specialty medications: No  Any gaps in refill history greater than 2 weeks in the last 3 months: no  Demonstrates understanding of importance of adherence: yes  Informant: patient  Provider-estimated medication adherence level: good  Patient is at risk for Non-Adherence: No          Specialty medication(s) dose(s) confirmed: Regimen is correct and unchanged.     Are there any concerns with adherence? No    Adherence counseling provided? Not needed    CLINICAL MANAGEMENT AND INTERVENTION      Clinical Benefit Assessment:    Do you feel the medicine is effective or helping your condition? Yes    HIV ASSOCIATED LABS:     Lab Results   Component Value Date/Time    HIVRS Detected (A) 09/29/2019 10:12 AM    HIVRS Detected (A) 07/08/2018 09:18 AM    HIVRS Detected (A) 01/07/2018 09:35 AM    HIVCP <40 (H) 09/29/2019 10:12 AM    HIVCP <40 03/18/2019 09:16 AM    HIVCP <40 (H) 07/08/2018 09:18 AM    HIVCP <40 (H) 01/07/2018 09:35 AM    ACD4 432 (L) 07/08/2018 09:18 AM    ACD4 316 (L) 01/07/2018 09:35 AM    ACD4 279 (L) 11/09/2017 02:25 PM       Clinical Benefit counseling provided? Labs from 09/29/19 show evidence of clinical benefit    Adverse Effects Assessment:    Are you experiencing any side effects? No    Are you experiencing difficulty administering your medicine? No    Quality of Life Assessment:    How many days over the past month did your HIV  keep you from your normal activities? For example, brushing your teeth or getting up in the morning. 0    Have you discussed this with your provider? Not needed    Therapy Appropriateness:  Is therapy appropriate? Yes, therapy is appropriate and should be continued    DISEASE/MEDICATION-SPECIFIC INFORMATION      N/A    PATIENT SPECIFIC NEEDS     - Does the patient have any physical, cognitive, or cultural barriers? No    - Is the patient high risk? No     - Does the patient require a Care Management Plan? No     - Does the patient require physician intervention or other additional services (i.e. nutrition, smoking cessation, social work)? No      SHIPPING     Specialty Medication(s) to be Shipped:   Infectious Disease: Biktarvy    Other medication(s) to be shipped: n/a     Changes to insurance: No    Delivery Scheduled: Yes, Expected medication delivery date: 10/07/19.     Medication will be delivered via UPS to the confirmed prescription address in [facility].    The patient will receive a drug information handout for each medication shipped and additional FDA Medication Guides as required.  Verified that patient has previously received a Conservation officer, historic buildings.    All of the patient's questions and concerns have been addressed.    Roderic Palau   [facility] Specialty Pharmacist

## 2019-10-03 LAB — HBV DNA QUANT: Hepatitis B virus DNA:PrThr:Pt:Bld:Ord:Probe.amp.tar: NOT DETECTED

## 2019-10-06 MED FILL — BIKTARVY 50 MG-200 MG-25 MG TABLET: ORAL | 30 days supply | Qty: 30 | Fill #1

## 2019-10-06 MED FILL — BIKTARVY 50 MG-200 MG-25 MG TABLET: 30 days supply | Qty: 30 | Fill #1 | Status: AC

## 2019-10-31 NOTE — Unmapped (Signed)
[facility] Refill Coordination Note    Specialty Medication(s) to be Shipped:   Infectious Disease: Biktarvy    Other medication(s) to be shipped: n/a     Gary Campbell, DOB: 1970/10/15  Phone: [phone] (home)       All above HIPAA information was verified with patient.     Was a Nurse, learning disability used for this call? No    Completed refill call assessment today to schedule patient's medication shipment from the [facility] [phone]).       Specialty medication(s) and dose(s) confirmed: Regimen is correct and unchanged.   Changes to medications: Gary Campbell reports no changes at this time.  Changes to insurance: No  Questions for the pharmacist: No    Confirmed patient received Welcome Packet with first shipment. The patient will receive a drug information handout for each medication shipped and additional FDA Medication Guides as required.       DISEASE/MEDICATION-SPECIFIC INFORMATION        N/A    SPECIALTY MEDICATION ADHERENCE     Medication Adherence    Patient reported X missed doses in the last month: 0  Specialty Medication: biktarvy  Informant: patient                Biktarvy 50-200-25 mg: 7 days of medicine on hand         SHIPPING     Shipping address confirmed in Epic.     Delivery Scheduled: Yes, Expected medication delivery date: 11/04/19.     Medication will be delivered via UPS to the prescription address in Epic WAM.    [name]   [facility] Specialty Technician

## 2019-11-03 MED FILL — BIKTARVY 50 MG-200 MG-25 MG TABLET: ORAL | 30 days supply | Qty: 30 | Fill #2

## 2019-11-03 MED FILL — BIKTARVY 50 MG-200 MG-25 MG TABLET: 30 days supply | Qty: 30 | Fill #2 | Status: AC

## 2019-11-11 ENCOUNTER — Ambulatory Visit: Admit: 2019-11-11 | Discharge: 2019-11-12 | Payer: PRIVATE HEALTH INSURANCE

## 2019-11-27 NOTE — Unmapped (Signed)
[facility] Refill Coordination Note    Specialty Medication(s) to be Shipped:   Infectious Disease: Biktarvy    Other medication(s) to be shipped: hydrochlorothiazide       [name], DOB: [birth year]  Phone: [phone] (home)       All above HIPAA information was verified with patient.     Was a Nurse, learning disability used for this call? No    Completed refill call assessment today to schedule patient's medication shipment from the [facility] [phone]).       Specialty medication(s) and dose(s) confirmed: Regimen is correct and unchanged.   Changes to medications: [name] reports no changes at this time.  Changes to insurance: No  Questions for the pharmacist: No    Confirmed patient received Welcome Packet with first shipment. The patient will receive a drug information handout for each medication shipped and additional FDA Medication Guides as required.       DISEASE/MEDICATION-SPECIFIC INFORMATION        N/A    SPECIALTY MEDICATION ADHERENCE     Medication Adherence    Patient reported X missed doses in the last month: 0  Specialty Medication: biktarvy                biktarvy  : 7 days of medicine on hand       SHIPPING     Shipping address confirmed in Epic.     Delivery Scheduled: Yes, Expected medication delivery date: 6/22.     Medication will be delivered via UPS to the prescription address in Epic WAM.    [name]   [facility] Specialty Technician

## 2019-12-01 MED FILL — BIKTARVY 50 MG-200 MG-25 MG TABLET: 30 days supply | Qty: 30 | Fill #3 | Status: AC

## 2019-12-01 MED FILL — HYDROCHLOROTHIAZIDE 25 MG TABLET: 90 days supply | Qty: 90 | Fill #1 | Status: AC

## 2019-12-01 MED FILL — HYDROCHLOROTHIAZIDE 25 MG TABLET: ORAL | 90 days supply | Qty: 90 | Fill #1

## 2019-12-01 MED FILL — BIKTARVY 50 MG-200 MG-25 MG TABLET: ORAL | 30 days supply | Qty: 30 | Fill #3

## 2019-12-02 DIAGNOSIS — B20 Human immunodeficiency virus [HIV] disease: Principal | ICD-10-CM

## 2019-12-02 MED ORDER — DESCOVY 200 MG-25 MG TABLET
ORAL_TABLET | Freq: Every day | ORAL | 6 refills | 30 days | Status: CP
Start: 2019-12-02 — End: ?
  Filled 2019-12-04: qty 30, 30d supply, fill #0

## 2019-12-02 MED ORDER — DORAVIRINE 100 MG TABLET
ORAL_TABLET | Freq: Every day | ORAL | 6 refills | 30 days | Status: CP
Start: 2019-12-02 — End: ?
  Filled 2019-12-04: qty 30, 30d supply, fill #0

## 2019-12-03 DIAGNOSIS — B20 Human immunodeficiency virus [HIV] disease: Principal | ICD-10-CM

## 2019-12-04 MED FILL — DESCOVY 200 MG-25 MG TABLET: 30 days supply | Qty: 30 | Fill #0 | Status: AC

## 2019-12-04 MED FILL — PIFELTRO 100 MG TABLET: 30 days supply | Qty: 30 | Fill #0 | Status: AC

## 2019-12-04 NOTE — Unmapped (Signed)
[facility] Specialty Medication Onboarding    Specialty Medication: Agricultural consultant  Prior Authorization: Not Required   Financial Assistance: Yes - copay card approved as secondary   Final Copay/Day Supply: $0 each / 30 days each    Insurance Restrictions: Yes - max 1 month supply     Notes to Pharmacist: Medication change    The triage team has completed the benefits investigation and has determined that the patient is able to fill this medication at [facility]. Please contact the patient to complete the onboarding or follow up with the prescribing physician as needed.

## 2019-12-04 NOTE — Unmapped (Signed)
[facility]   Patient Onboarding/Medication Counseling    Gary Campbell is a 49 y.o. male with HIV who I am counseling today on initiation of therapy.  I am speaking to the patient.    Was a Nurse, learning disability used for this call? No    Verified patient's date of birth / HIPAA.    Specialty medication(s) to be sent: Infectious Disease: Descovy and Pifeltro      Non-specialty medications/supplies to be sent: n/a      Medications not needed at this time: n/a         Pifeltro (doravirine) 100mg     Medication & Administration     Dosage: Take one tablet by mouth daily    Administration:   ??? Take with or without food  ??? Take at the same time of day    Adherence/Missed dose instructions: take missed dose as soon as you remember. If it is close to the time of your next dose, skip the dose and resume with your next scheduled dose.    Goals of Therapy     The goal is to suppress replication of the HIV virus such that the virus is undetectable by lab tests    Side Effects & Monitoring Parameters   ??? Upset stomach.  ??? Feeling dizzy, tired, or weak.  ??? Strange or odd dreams.  ??? Trouble sleeping.  ??? Stomach pain or diarrhea.  ??? Headache.      The following side effects should be reported to the provider:  ??? Signs of an allergic reaction, like rash; hives; itching; red, swollen, blistered, or peeling skin with or without fever.   ??? If you have wheezing; tightness in the chest or throat; trouble breathing, swallowing, or talking; unusual hoarseness; or swelling of the mouth, face, lips, tongue, or throat call 911 or go to the closest hospital emergency department  ??? Changes in your immune system can happen when you start taking drugs to treat HIV. If you have an infection that you did not know you had, it may show up when you take this drug. Tell your doctor right away if you have any new signs after you start this drug, even after taking it for several months. This includes signs of infection like fever, sore throat, weakness, cough, or shortness of breath.  Monitoring Parameters: viral load and CD4 count    Contraindications, Warnings, & Precautions     ??? Concurrent administration of strong CYP3A inducers, including, but not limited to the following: Carbamazepine, oxcarbazepine, phenobarbital, phenytoin, enzalutamide, rifampin, rifapentine, mitotane, St John's wort  ??? Immune reconstitution syndrome: Patients may develop immune reconstitution syndrome resulting in the occurrence of an inflammatory response to an indolent or residual opportunistic infection during initial HIV treatment or activation of autoimmune disorders (eg, Graves disease, polymyositis, Guillain-Barr?? syndrome, autoimmune hepatitis) later in therapy; further evaluation and treatment may be required.    Drug/Food Interactions     ??? Medication list reviewed in Epic. The patient was instructed to inform the care team before taking any new medications or supplements. No drug interactions identified.   ??? Tell your doctor if you have taken rifabutin in the past 4 weeks    Storage, Handling Precautions, & Disposal     ??? Store in the original container at room temperature.  ??? Store in a dry place. Do not store in a bathroom.  ??? Keep lid tightly closed.  ??? Keep all drugs in a safe place. Keep all drugs out of  the reach of children and pets.  ??? Throw away unused or expired drugs. Do not flush down a toilet or pour down a drain unless you are told to do so  ??? There may be drug take-back programs in your area.    Descovy (emtricitabine and tenofovir alafenamide)    Medication & Administration     Dosage: Take 1 tablet by mouth daily    Administration: Take with or without food    Adherence/Missed dose instructions: take missed dose as soon as you remember. If it is close to the time of your next dose, skip the dose and resume with your next scheduled dose.    Goals of Therapy     ??? Keep HIV levels non-detectable on lab tests    Side Effects & Monitoring Parameters Common Side Effects:    ??? Upset stomach  ??? Diarrhea    The following side effects should be reported to the provider:    ?? Signs of an allergic reaction, like rash; hives; itching; red, swollen, blistered, or peeling skin with or without fever; wheezing; tightness in the chest or throat; trouble breathing, swallowing, or talking; unusual hoarseness; or swelling of the mouth, face, lips, tongue, or throat.   ?? Signs of kidney problems like unable to pass urine, change in how much urine is passed, blood in the urine, or a big weight gain.  ?? Signs of liver problems like dark urine, feeling tired, not hungry, upset stomach or stomach pain, light-colored stools, throwing up, or yellow skin or eyes.   ?? Signs of too much lactic acid in the blood (lactic acidosis) like fast breathing, fast heartbeat, a heartbeat that does not feel normal, very bad upset stomach or throwing up, feeling very sleepy, shortness of breath, feeling very tired or weak, very bad dizziness, feeling cold, or muscle pain or cramps  ?? Weight gain    Monitoring Parameters:     - Serum creatinine  - Urine glucose  - Urine protein (prior to or when initiating therapy and as clinically indicated during therapy);  - Serum phosphorus (in patients with chronic kidney disease)  - Hepatic function tests  - Testing for hepatitis B virus (HBV) is recommended prior to or when initiating antiretroviral therapy.  - Patients with HIV and HBV coinfection should be monitored for several months following therapy discontinuation.  - CD4 count  - HIV RNA plasma levels     Contraindications, Warnings, & Precautions     ?? Signs and symptoms of immune reconstitution syndrome  ?? Signs and symptoms of lactic acidosis  ?? Hepatomegaly  ?? Steatosis  ?? Renal toxicity    Drug/Food Interactions     ??? Medication list reviewed in Epic. The patient was instructed to inform the care team before taking any new medications or supplements. No drug interactions identified.     Storage, Handling Precautions, & Disposal     ?? Store this medication at room temperature.   ?? Store in the original container   ?? Keep lid tightly closed.   ?? Store in a dry place. Do not store in a bathroom.   ?? Keep all drugs in a safe place. Keep all drugs out of the reach of children and pets.   ?? Throw away unused or expired drugs. Do not flush down a toilet or pour down a drain unless you are told to do so. Check with your pharmacist if you have questions about the best way to throw out drugs.  There may be drug take-back programs in your area        Current Medications (including OTC/herbals), Comorbidities and Allergies     Current Outpatient Medications   Medication Sig Dispense Refill   ??? doravirine 100 mg Tab Take 1 tablet (100 mg total) by mouth daily. 30 tablet 6   ??? emtricitabine-tenofovir alafen (DESCOVY) 200-25 mg tablet Take 1 tablet by mouth daily. 30 tablet 6   ??? hydroCHLOROthiazide (HYDRODIURIL) 25 MG tablet Take 1 tablet (25 mg total) by mouth daily. 90 tablet 10   ??? ibuprofen (ADVIL,MOTRIN) 200 MG tablet Take 400 mg by mouth every six (6) hours as needed for pain.     ??? sildenafiL (VIAGRA) 100 MG tablet Take 1 tablet (100 mg total) by mouth once as needed for erectile dysfunction for up to 1 dose. 1 tablet 6   ??? sodium,potassium,mag sulfates (SUPREP BOWEL PREP KIT) 17.5-3.13-1.6 gram SolR Take as directed instructions mailed to patient (Patient not taking: Reported on 09/29/2019) 1 Bottle 0     No current facility-administered medications for this visit.       No Known Allergies    Patient Active Problem List   Diagnosis   ??? HIV disease (CMS-HCC)   ??? HBV (hepatitis B virus) infection       Reviewed and up to date in Epic.    Appropriateness of Therapy     Is medication and dose appropriate based on diagnosis? Yes    Prescription has been clinically reviewed: Yes    Baseline Quality of Life Assessment      How many days over the past month did your HIV  keep you from your normal activities? For example, brushing your teeth or getting up in the morning. 0    Financial Information     Medication Assistance provided: Copay Assistance    Anticipated copay of $0.00 reviewed with patient. Verified delivery address.    Delivery Information     Scheduled delivery date: 12/05/19    Expected start date: 12/05/19    Medication will be delivered via UPS to the prescription address in [facility].  This shipment will not require a signature.      Explained the services we provide at [facility] and that each month we would call to set up refills.  Stressed importance of returning phone calls so that we could ensure they receive their medications in time each month.  Informed patient that we should be setting up refills 7-10 days prior to when they will run out of medication.  A pharmacist will reach out to perform a clinical assessment periodically.  Informed patient that a welcome packet and a drug information handout will be sent.      Patient verbalized understanding of the above information as well as how to contact the pharmacy at [phone] option 4 with any questions/concerns.  The pharmacy is open Monday through Friday 8:30am-4:30pm.  A pharmacist is available 24/7 via pager to answer any clinical questions they may have.    Patient Specific Needs     - Does the patient have any physical, cognitive, or cultural barriers? No    - Patient prefers to have medications discussed with  Patient     - Is the patient or caregiver able to read and understand education materials at a high school level or above? Yes    - Patient's primary language is  English     - Is the patient high risk? No     -  Does the patient require a Care Management Plan? No     - Does the patient require physician intervention or other additional services (i.e. nutrition, smoking cessation, social work)? No      Roderic Palau  [facility] Specialty Pharmacist

## 2019-12-26 MED FILL — DESCOVY 200 MG-25 MG TABLET: ORAL | 30 days supply | Qty: 30 | Fill #1

## 2019-12-26 MED FILL — DESCOVY 200 MG-25 MG TABLET: 30 days supply | Qty: 30 | Fill #1 | Status: AC

## 2019-12-26 NOTE — Unmapped (Signed)
Gary Campbell Gary Campbell Clinical Assessment & Refill Coordination Note    Gary Campbell, DOB: [birth year]  Phone: [phone] (home)     All above HIPAA information was verified with patient.     Was a Nurse, learning disability used for this call? No    Specialty Medication(s):   Infectious Disease: Descovy and Pifeltro     Current Outpatient Medications   Medication Sig Dispense Refill   ??? doravirine 100 mg Tab Take 1 tablet (100 mg total) by mouth daily. 30 tablet 6   ??? emtricitabine-tenofovir alafen (DESCOVY) 200-25 mg tablet Take 1 tablet by mouth daily. 30 tablet 6   ??? hydroCHLOROthiazide (HYDRODIURIL) 25 MG tablet Take 1 tablet (25 mg total) by mouth daily. 90 tablet 10   ??? ibuprofen (ADVIL,MOTRIN) 200 MG tablet Take 400 mg by mouth every six (6) hours as needed for pain.     ??? sildenafiL (VIAGRA) 100 MG tablet Take 1 tablet (100 mg total) by mouth once as needed for erectile dysfunction for up to 1 dose. 1 tablet 6   ??? sodium,potassium,mag sulfates (SUPREP BOWEL PREP KIT) 17.5-3.13-1.6 gram SolR Take as directed instructions mailed to patient (Patient not taking: Reported on 09/29/2019) 1 Bottle 0     No current facility-administered medications for this visit.        Changes to medications: [name] reports no changes at this time.    No Known Allergies    Changes to allergies: No    SPECIALTY MEDICATION ADHERENCE     Descovy   : 7 days of medicine on hand   Pifeltro 100 mg: 7 days of medicine on hand       Medication Adherence    Patient reported X missed doses in the last month: 0  Specialty Medication: Descovy  Patient is on additional specialty medications: Yes  Additional Specialty Medications: Pifeltro 100mg   Patient Reported Additional Medication X Missed Doses in the Last Month: 0  Patient is on more than two specialty medications: No  Demonstrates understanding of importance of adherence: yes  Informant: patient  Provider-estimated medication adherence level: good  Patient is at risk for Non-Adherence: No          Specialty medication(s) dose(s) confirmed: Regimen is correct and unchanged.     Are there any concerns with adherence? No    Adherence counseling provided? Not needed    CLINICAL MANAGEMENT AND INTERVENTION      Clinical Benefit Assessment:    Do you feel the medicine is effective or helping your condition? Yes    Clinical Benefit counseling provided? Not needed    Adverse Effects Assessment:    Are you experiencing any side effects? No    Are you experiencing difficulty administering your medicine? No    Quality of Life Assessment:    How many days over the past month did your HIV  keep you from your normal activities? For example, brushing your teeth or getting up in the morning. 0    Have you discussed this with your provider? Not needed    Therapy Appropriateness:    Is therapy appropriate? Yes, therapy is appropriate and should be continued    DISEASE/MEDICATION-SPECIFIC INFORMATION      N/A    PATIENT SPECIFIC NEEDS     - Does the patient have any physical, cognitive, or cultural barriers? No    - Is the patient high risk? No     - Does the patient require a Care Management Plan? No     -  Does the patient require physician intervention or other additional services (i.e. nutrition, smoking cessation, social work)? No      SHIPPING     Specialty Medication(s) to be Shipped:   Infectious Disease: Descovy   His Pifeltro will be filled at [facility] this time: re: needs medicine before 12/30/19 and the earliest I can get it to him is 12/30/19    Other medication(s) to be shipped: n/a     Changes to insurance: No    Delivery Scheduled: Yes, Expected medication delivery date: 12/29/19.     Medication will be delivered via UPS to the confirmed prescription address in [facility].    The patient will receive a drug information handout for each medication shipped and additional FDA Medication Guides as required.  Verified that patient has previously received a Conservation officer, historic buildings.    All of the patient's questions and concerns have been addressed.    Gary Campbell   Gary Campbell [facility] Specialty Pharmacist

## 2020-01-09 MED ORDER — SILDENAFIL 100 MG TABLET
ORAL_TABLET | Freq: Once | ORAL | 6 refills | 1.00000 days | PRN
Start: 2020-01-09 — End: ?

## 2020-01-09 NOTE — Unmapped (Unsigned)
Sildenafil refill  

## 2020-01-10 MED ORDER — SILDENAFIL 100 MG TABLET
ORAL_TABLET | Freq: Once | ORAL | 6 refills | 1.00000 days | Status: CP | PRN
Start: 2020-01-10 — End: ?

## 2020-01-26 DIAGNOSIS — B20 Human immunodeficiency virus [HIV] disease: Principal | ICD-10-CM

## 2020-01-26 MED ORDER — DORAVIRINE 100 MG TABLET
ORAL_TABLET | Freq: Every day | ORAL | 6 refills | 30.00000 days
Start: 2020-01-26 — End: ?

## 2020-01-26 NOTE — Unmapped (Signed)
[facility] Refill Coordination Note    Specialty Medication(s) to be Shipped:   Infectious Disease: Descovy and Pifeltro    Other medication(s) to be shipped: No additional medications requested for fill at this time     Gary Campbell, DOB: [birth year]  Phone: [phone] (home)       All above HIPAA information was verified with patient.     Was a Nurse, learning disability used for this call? No    Completed refill call assessment today to schedule patient's medication shipment from the [facility] [phone]).       Specialty medication(s) and dose(s) confirmed: Regimen is correct and unchanged.   Changes to medications: Gary Campbell reports no changes at this time.  Changes to insurance: No  Questions for the pharmacist: No    Confirmed patient received Welcome Packet with first shipment. The patient will receive a drug information handout for each medication shipped and additional FDA Medication Guides as required.       DISEASE/MEDICATION-SPECIFIC INFORMATION        N/A    SPECIALTY MEDICATION ADHERENCE     Medication Adherence    Patient reported X missed doses in the last month: 0  Specialty Medication: descovy  Patient is on additional specialty medications: Yes  Additional Specialty Medications: pifeltro  Patient Reported Additional Medication X Missed Doses in the Last Month: 0                descovy  : 6 days of medicine on hand   pifeltro  : 6 days of medicine on hand         SHIPPING     Shipping address confirmed in Epic.     Delivery Scheduled: Yes, Expected medication delivery date: 8/19.     Medication will be delivered via UPS to the prescription address in Epic [facility].    Gary Campbell   [facility] Specialty Technician

## 2020-01-28 DIAGNOSIS — B20 Human immunodeficiency virus [HIV] disease: Principal | ICD-10-CM

## 2020-01-28 MED ORDER — DORAVIRINE 100 MG TABLET
ORAL_TABLET | Freq: Every day | ORAL | 6 refills | 30.00000 days | Status: CP
Start: 2020-01-28 — End: ?
  Filled 2020-01-29: qty 30, 30d supply, fill #0

## 2020-01-28 MED FILL — DESCOVY 200 MG-25 MG TABLET: ORAL | 30 days supply | Qty: 30 | Fill #2

## 2020-01-28 MED FILL — DESCOVY 200 MG-25 MG TABLET: 30 days supply | Qty: 30 | Fill #2 | Status: AC

## 2020-01-28 NOTE — Unmapped (Signed)
Gary Campbell 's PIFELTRO shipment will be delayed as a result of no refills remain on the prescription.      I have reached out to the patient and communicated the delay. We will call the patient back to reschedule the delivery upon resolution. We have not confirmed the new delivery date.

## 2020-01-29 MED FILL — PIFELTRO 100 MG TABLET: 30 days supply | Qty: 30 | Fill #0 | Status: AC

## 2020-01-29 NOTE — Unmapped (Signed)
UPDATE:     New Rx for Pifeltro received.  I spoke to [name] and he has confirmed that it is ok to ship it out today so he can receive it on 01/30/20 via UPS.    Corliss Skains. [place], [place].D.  Specialty Pharmacist  [facility]  [phone] option 4

## 2020-02-23 NOTE — Unmapped (Signed)
[facility] Refill Coordination Note    Specialty Medication(s) to be Shipped:   Infectious Disease: Descovy and Pifeltro    Other medication(s) to be shipped: hydrochlorothiazide       Gary Campbell, DOB: May 14, 1971  Phone: [phone] (home)       All above HIPAA information was verified with patient.     Was a Nurse, learning disability used for this call? No    Completed refill call assessment today to schedule patient's medication shipment from the [facility] [phone]).       Specialty medication(s) and dose(s) confirmed: Regimen is correct and unchanged.   Changes to medications: Gary Campbell reports no changes at this time.  Changes to insurance: No  Questions for the pharmacist: No    Confirmed patient received Welcome Packet with first shipment. The patient will receive a drug information handout for each medication shipped and additional FDA Medication Guides as required.       DISEASE/MEDICATION-SPECIFIC INFORMATION        N/A    SPECIALTY MEDICATION ADHERENCE     Medication Adherence    Specialty Medication: pifeltro  Patient is on additional specialty medications: Yes  Additional Specialty Medications: descovy            Unable to confirm quantity on hand      SHIPPING     Shipping address confirmed in Epic.     Delivery Scheduled: Yes, Expected medication delivery date: 9/17.     Medication will be delivered via UPS to the prescription address in Epic [facility].    Gary Campbell   [facility] Specialty Technician

## 2020-02-26 MED FILL — HYDROCHLOROTHIAZIDE 25 MG TABLET: 90 days supply | Qty: 90 | Fill #2 | Status: AC

## 2020-02-26 MED FILL — PIFELTRO 100 MG TABLET: 30 days supply | Qty: 30 | Fill #1 | Status: AC

## 2020-02-26 MED FILL — HYDROCHLOROTHIAZIDE 25 MG TABLET: ORAL | 90 days supply | Qty: 90 | Fill #2

## 2020-02-26 MED FILL — PIFELTRO 100 MG TABLET: ORAL | 30 days supply | Qty: 30 | Fill #1

## 2020-02-26 MED FILL — DESCOVY 200 MG-25 MG TABLET: 30 days supply | Qty: 30 | Fill #3 | Status: AC

## 2020-02-26 MED FILL — DESCOVY 200 MG-25 MG TABLET: ORAL | 30 days supply | Qty: 30 | Fill #3

## 2020-03-25 NOTE — Unmapped (Signed)
[facility] Refill Coordination Note    Specialty Medication(s) to be Shipped:   Infectious Disease: Descovy and Pifeltro    Other medication(s) to be shipped: No additional medications requested for fill at this time     Gary Campbell, DOB: July 13, 1970  Phone: [phone] (home)       All above HIPAA information was verified with patient.     Was a Nurse, learning disability used for this call? No    Completed refill call assessment today to schedule patient's medication shipment from the [facility] [phone]).       Specialty medication(s) and dose(s) confirmed: Regimen is correct and unchanged.   Changes to medications: Gary Campbell reports no changes at this time.  Changes to insurance: No  Questions for the pharmacist: No    Confirmed patient received Welcome Packet with first shipment. The patient will receive a drug information handout for each medication shipped and additional FDA Medication Guides as required.       DISEASE/MEDICATION-SPECIFIC INFORMATION        N/A    SPECIALTY MEDICATION ADHERENCE     Medication Adherence    Patient reported X missed doses in the last month: 0  Specialty Medication: descovy  Patient is on additional specialty medications: Yes  Additional Specialty Medications: pifeltro  Patient Reported Additional Medication X Missed Doses in the Last Month: 0          Unable to confirm quantity on hand      SHIPPING     Shipping address confirmed in Epic.     Delivery Scheduled: Yes, Expected medication delivery date: 10/19.     Medication will be delivered via UPS to the prescription address in Epic WAM.    Gary Campbell   [facility] Specialty Technician

## 2020-03-29 DIAGNOSIS — B20 Human immunodeficiency virus [HIV] disease: Principal | ICD-10-CM

## 2020-03-29 MED FILL — DESCOVY 200 MG-25 MG TABLET: ORAL | 30 days supply | Qty: 30 | Fill #4

## 2020-03-29 MED FILL — PIFELTRO 100 MG TABLET: ORAL | 30 days supply | Qty: 30 | Fill #2

## 2020-03-29 MED FILL — PIFELTRO 100 MG TABLET: 30 days supply | Qty: 30 | Fill #2 | Status: AC

## 2020-03-29 MED FILL — DESCOVY 200 MG-25 MG TABLET: 30 days supply | Qty: 30 | Fill #4 | Status: AC

## 2020-04-05 ENCOUNTER — Ambulatory Visit
Admit: 2020-04-05 | Discharge: 2020-04-05 | Payer: PRIVATE HEALTH INSURANCE | Attending: Infectious Disease | Primary: Infectious Disease

## 2020-04-05 DIAGNOSIS — B20 Human immunodeficiency virus [HIV] disease: Principal | ICD-10-CM

## 2020-04-05 DIAGNOSIS — Z113 Encounter for screening for infections with a predominantly sexual mode of transmission: Principal | ICD-10-CM

## 2020-04-05 DIAGNOSIS — B181 Chronic viral hepatitis B without delta-agent: Principal | ICD-10-CM

## 2020-04-05 LAB — CBC W/ AUTO DIFF
BASOPHILS ABSOLUTE COUNT: 0 10*9/L (ref 0.0–0.1)
BASOPHILS RELATIVE PERCENT: 0.8 %
EOSINOPHILS ABSOLUTE COUNT: 0.1 10*9/L (ref 0.0–0.7)
HEMOGLOBIN: 14.7 g/dL (ref 13.5–17.5)
LYMPHOCYTES ABSOLUTE COUNT: 1.9 10*9/L (ref 0.7–4.0)
LYMPHOCYTES RELATIVE PERCENT: 33.4 %
MEAN CORPUSCULAR HEMOGLOBIN CONC: 33.3 g/dL (ref 30.0–36.0)
MEAN CORPUSCULAR HEMOGLOBIN: 27.7 pg (ref 26.0–34.0)
MEAN PLATELET VOLUME: 7.2 fL (ref 7.0–10.0)
MONOCYTES ABSOLUTE COUNT: 0.4 10*9/L (ref 0.1–1.0)
MONOCYTES RELATIVE PERCENT: 7.4 %
NEUTROPHILS ABSOLUTE COUNT: 3.2 10*9/L (ref 1.7–7.7)
NEUTROPHILS RELATIVE PERCENT: 56.9 %
NUCLEATED RED BLOOD CELLS: 0 /100{WBCs} (ref <=4)
PLATELET COUNT: 242 10*9/L (ref 150–450)
RED CELL DISTRIBUTION WIDTH: 13.1 % (ref 12.0–15.0)
WBC ADJUSTED: 5.6 10*9/L (ref 3.5–10.5)

## 2020-04-05 LAB — COMPREHENSIVE METABOLIC PANEL
ALBUMIN: 4 g/dL (ref 3.4–5.0)
ALKALINE PHOSPHATASE: 64 U/L (ref 46–116)
ALT (SGPT): 17 U/L (ref 10–49)
ANION GAP: 3 mmol/L — ABNORMAL LOW (ref 5–14)
AST (SGOT): 23 U/L (ref <=34)
BILIRUBIN TOTAL: 0.7 mg/dL (ref 0.3–1.2)
BLOOD UREA NITROGEN: 18 mg/dL (ref 9–23)
BUN / CREAT RATIO: 17
CALCIUM: 10.1 mg/dL (ref 8.7–10.4)
CHLORIDE: 102 mmol/L (ref 98–107)
CO2: 33.8 mmol/L — ABNORMAL HIGH (ref 20.0–31.0)
CREATININE: 1.04 mg/dL
EGFR CKD-EPI AA MALE: >90 mL/min/{1.73_m2} (ref >=60)
EGFR CKD-EPI NON-AA MALE: 83 mL/min/{1.73_m2} (ref >=60)
GLUCOSE RANDOM: 91 mg/dL (ref 70–179)
POTASSIUM: 3.4 mmol/L (ref 3.4–4.5)
SODIUM: 139 mmol/L (ref 135–145)

## 2020-04-05 LAB — NEUTROPHILS ABSOLUTE COUNT: Neutrophils:NCnc:Pt:Bld:Qn:Automated count: 3.2

## 2020-04-05 LAB — GLUCOSE RANDOM: Glucose:MCnc:Pt:Ser/Plas:Qn:: 91

## 2020-04-05 MED ORDER — ATORVASTATIN 20 MG TABLET
ORAL_TABLET | Freq: Every day | ORAL | 11 refills | 30.00000 days | Status: CP
Start: 2020-04-05 — End: 2020-05-05
  Filled 2020-04-09: qty 30, 30d supply, fill #0

## 2020-04-05 MED ORDER — AMLODIPINE 10 MG TABLET
ORAL_TABLET | Freq: Every day | ORAL | 11 refills | 30 days | Status: CP
Start: 2020-04-05 — End: 2020-05-05
  Filled 2020-04-09: qty 30, 30d supply, fill #0

## 2020-04-05 NOTE — Unmapped (Signed)
PCP:  Gary Sniff, MD    04/05/2020    This is a RETURN visit for this 49 y.o. male with the following diagnoses:    Patient Active Problem List    Diagnosis Date Noted   ??? HIV disease (CMS-HCC) 08/06/2017   ??? HBV (hepatitis B virus) infection 08/06/2017       ASSESSMENT/PLAN:    HIV  ?? Dx'd in 1989.   ?? Was cared for here until ~2003. Most recently cared for at [facility] by Gary Campbell  ?? Was on Viread+Tivicay+Prezcobix when switched over from [facility] to [facility]  ?? On transfer had HIV RNA <20c/mL  ?? Has HBV co-infection  ?? Documented K103N and 184V  ?? I changed him to [facility] which he started May 2019  ?? HIV RNA <40 c/mL on this but gained a lot of wt   ?? Changed to TAF/FTC + DOR   ?? Wt down but likely more due to diet and exercise plus he had liposuction!  ?? Recheck labs today    HTN  ?? On HCTZ 25 mg daily but BP very high  ?? Some ED since starting (lost am erections)  ?? ACEi gave him cough in past  ?? Adding Norvasc 10 mg today - discussed possible peripheral edema     HBV Surface Ag+; e Antigen+; DNA un-detectable  ?? Pre-core mutant   ?? On TAF and FTC   ?? HBV DNA repeatedly undetectable   ?? Needs q6 month ultrasounds. Last was in April 2021 and was neg [facility]. Will order another     Insomnia  ?? Chronic issue  ?? Poor sleep hygiene      Sexual Health  ?? MSM  ?? Unprotected sex.   ?? Check GC/Ch throat and urine and RPR.    Mental Health  ?? Stable and good    Health Maintenance   ?? Immunizations: Got flu shot. Got C19 vaccine x 3. PCV23 today  ?? Cancer Screening: Family hx of early colon cancer. Will get colonoscopy locally. US Liver ordered.   ?? Lipids: High LDL-C. Will start atorvastatin. He is aware of potential adverse effects including muscle inflammation and will stop and contact me if occurs.  ?? Other:   ?? Had gastric sleeve 3 years ago. Has kept wt off. Not on PPIs.  ?? Follow BP  ?? Has 3/6 systolic murmur - known and has had echocardiogram he said without concerns.     Will follow up in 6 months.       Chief Complaint: HIV follow-up    HPI:  [name] returns for an appointment.       ROS:   No fever chills, sweats, nausea, vomiting, diarrhea, rash, joint pain. Pos for  insomnia (chronic)    All other systems are negative.    ALLERGIES:  Ace inhibitors    MEDICATIONS:  Current Outpatient Medications   Medication Sig Dispense Refill   ??? doravirine 100 mg Tab Take 1 tablet (100 mg total) by mouth daily. 30 tablet 6   ??? emtricitabine-tenofovir alafen (DESCOVY) 200-25 mg tablet Take 1 tablet by mouth daily. 30 tablet 6   ??? hydroCHLOROthiazide (HYDRODIURIL) 25 MG tablet Take 1 tablet (25 mg total) by mouth daily. 90 tablet 10   ??? ibuprofen (ADVIL,MOTRIN) 200 MG tablet Take 400 mg by mouth every six (6) hours as needed for pain.     ??? sildenafiL (VIAGRA) 100 MG tablet Take 1 tablet (100 mg total)  by mouth once as needed for erectile dysfunction for up to 1 dose. 1 tablet 6   ??? amLODIPine (NORVASC) 10 MG tablet Take 1 tablet (10 mg total) by mouth daily. 30 tablet 11   ??? atorvastatin (LIPITOR) 20 MG tablet Take 1 tablet (20 mg total) by mouth daily. 30 tablet 11     No current facility-administered medications for this visit.       PHYSICAL EXAM:  Vitals:    04/05/20 0925   BP: 161/103   Pulse: 56   Temp: 36.8 ??C (98.3 ??F)     CONSTITUTIONAL: Alert,well appearing, no distress  HEENT: Moist mucous membranes, oropharynx clear without erythema or exudate  NECK: Supple, no lymphadenopathy  CARDIOVASCULAR: Regular, 3/6 SEM.   PULM: Clear to auscultation bilaterally  GASTROINTESTINAL: Soft, active bowel sounds, nontender  EXTREMITIES: No lower extremity edema bilaterally, dorsalis pedis pulses 2+ bilaterally   SKIN: No rashes or lesions  NEUROLOGIC: No focal motor or sensory deficits    DATA:    Results for orders placed or performed in visit on 09/29/19   Chlamydia/Gonorrhoeae NAA    Specimen: Throat; Swab   Result Value Ref Range    Chlamydia trachomatis, NAA Negative Negative    Gonorrhoeae NAA Negative Negative    CT/GC Specimen Type Swab     CT/GC Specimen Source Throat    Chlamydia/Gonorrhoeae NAA    Specimen: Urine (Male)   Result Value Ref Range    Chlamydia trachomatis, NAA Negative Negative    Gonorrhoeae NAA Negative Negative    CT/GC Specimen Type Urine     CT/GC Specimen Source Urine (Male)    HIV RNA, Quantitative, PCR   Result Value Ref Range    HIV RNA Quant Result Detected (A) Not Detected    HIV RNA <40 (H) <0 copies/mL    HIV RNA Log(10)      HIV RNA Comment     Hepatitis B DNA, Quantitative, PCR   Result Value Ref Range    HBV DNA Quant Not Detected Not Detected   Lipid Panel   Result Value Ref Range    Triglycerides 129 0 - 150 mg/dL    Cholesterol 324 (H) <=200 mg/dL    HDL 48 40 - 60 mg/dL    LDL Calculated 401 (H) 40 - 100 mg/dL    VLDL Cholesterol Cal 25.8 12 - 47 mg/dL    Chol/HDL Ratio 4.9 (H) 1.0 - 4.5    Non-HDL Cholesterol 187 (H) 70 - 130 mg/dL    FASTING Unknown    Hepatitis C Antibody   Result Value Ref Range    Hepatitis C Ab Nonreactive Nonreactive   Hepatitis B Surface Antigen   Result Value Ref Range    Hep B Surface Ag (A) Nonreactive     For final results see Hepatitis B Surface Antigen Neutralization   Hepatitis B Surface Antibody   Result Value Ref Range    Hep B S Ab Nonreactive Nonreactive, Grayzone    Hep B Surf Ab Quant <8.00 <8.00 m(IU)/mL   Syphilis Screen   Result Value Ref Range    RPR Nonreactive Nonreactive   Comprehensive Metabolic Panel   Result Value Ref Range    Sodium 137 135 - 145 mmol/L    Potassium 3.4 (L) 3.5 - 5.1 mmol/L    Chloride 101 98 - 107 mmol/L    Anion Gap 4 3 - 11 mmol/L    CO2 32.1 (H) 20.0 - 31.0 mmol/L  BUN 12 9 - 23 mg/dL    Creatinine 1.61 0.96 - 1.10 mg/dL    BUN/Creatinine Ratio 12     EGFR CKD-EPI Non-African American, Male 85 mL/min/1.63m2    EGFR CKD-EPI African American, Male >90 mL/min/1.33m2    Glucose 97 70 - 179 mg/dL    Calcium 04.5 8.7 - 40.9 mg/dL    Albumin 4.4 3.4 - 5.0 g/dL    Total Protein 8.3 (H) 5.7 - 8.2 g/dL    Total Bilirubin 0.9 0.3 - 1.2 mg/dL AST 24 <81 U/L    ALT 26 10 - 49 U/L    Alkaline Phosphatase 90 46 - 116 U/L   HEP.B NEUTRALIZATION   Result Value Ref Range    Hep B S Ag Neutralization (A) Reactive by screening assay;non-confirmable by neutralization assay     Hepatitis B Surface Antigen POSITIVE;confirmed by neutralization   CBC w/ Differential   Result Value Ref Range    WBC 5.2 3.5 - 10.5 10*9/L    RBC 5.59 4.32 - 5.72 10*12/L    HGB 15.4 13.5 - 17.5 g/dL    HCT 19.1 47.8 - 29.5 %    MCV 83.5 81.0 - 95.0 fL    MCH 27.5 26.0 - 34.0 pg    MCHC 32.9 30.0 - 36.0 g/dL    RDW 62.1 30.8 - 65.7 %    MPV 7.4 7.0 - 10.0 fL    Platelet 263 150 - 450 10*9/L    nRBC 0 <=4 /100 WBCs    Neutrophils % 61.2 %    Lymphocytes % 28.3 %    Monocytes % 8.7 %    Eosinophils % 0.8 %    Basophils % 1.0 %    Absolute Neutrophils 3.2 1.7 - 7.7 10*9/L    Absolute Lymphocytes 1.5 0.7 - 4.0 10*9/L    Absolute Monocytes 0.4 0.1 - 1.0 10*9/L    Absolute Eosinophils 0.0 0.0 - 0.7 10*9/L    Absolute Basophils 0.1 0.0 - 0.1 10*9/L        Creatinine   Date Value Ref Range Status   09/29/2019 1.02 0.60 - 1.10 mg/dL Final   84/69/6295 2.84 (H) 0.76 - 1.27 mg/dL Final   13/24/4010 2.72 0.70 - 1.30 mg/dL Final   53/66/4403 4.74 0.70 - 1.30 mg/dL Final   25/95/6387 5.64 0.70 - 1.30 mg/dL Final   33/29/5188 4.16 0.80 - 1.30 mg/dL Final     Comment:     Interpretive Data: * This creatinine method is traceable to a GC-IDMS method and NIST standard reference material. (Entered 04/06/2011 17:09:22 By George Hugh       Triglycerides   Date Value Ref Range Status   09/29/2019 129 0 - 150 mg/dL Final   60/63/0160 109 0.00 - 149.00 mg/dL Final     HDL   Date Value Ref Range Status   09/29/2019 48 40 - 60 mg/dL Final   32/35/5732 41 >20 mg/dL Final     LDL Calculated   Date Value Ref Range Status   09/29/2019 161 (H) 40 - 100 mg/dL Final     Comment:     NHLBI Recommended Ranges, LDL Cholesterol, for Adults (20+yrs) (ATPIII), mg/dL  Optimal              <254  Near Optimal 100-129  Borderline High     130-159  High                160-189  Very High            >=  190  NHLBI Recommended Ranges, LDL Cholesterol, for Children (2-19 yrs), mg/dL  Desirable            <161  Borderline High     110-129  High                 >=130     03/18/2019 155 (H) 0 - 99 mg/dL Final       IMAGING STUDIES:  None

## 2020-04-05 NOTE — Unmapped (Signed)
Called pt to talk about RW services and Caps on Charges at our clinic. Pt declined RW services at this time.     Sherene Sires     Time Duration of intervention in minutes: 5 mins

## 2020-04-06 LAB — HEPATITIS C ANTIBODY
HEPATITIS C ANTIBODY: NONREACTIVE
Hepatitis C virus Ab:PrThr:Pt:Ser:Ord:: NONREACTIVE

## 2020-04-06 LAB — LYMPH MARKER LIMITED,FLOW
ABSOLUTE CD3 CNT: 1406 {cells}/uL (ref 915–3400)
ABSOLUTE CD8 CNT: 931 {cells}/uL (ref 180–1520)
CD4% (T HELPER)": 23 % — ABNORMAL LOW (ref 34–58)
CD4:CD8 RATIO: 0.5 — ABNORMAL LOW (ref 0.9–4.8)

## 2020-04-06 LAB — CD3% (T CELLS)": Lab: 74

## 2020-04-06 LAB — SYPHILIS RPR SCREEN: Reagin Ab:PrThr:Pt:Ser:Ord:RPR: NONREACTIVE

## 2020-04-07 NOTE — Unmapped (Signed)
Pt. Left VM requesting a return call, but he didn't say what he needed. RN left VM requesting patient to call back.

## 2020-04-08 LAB — HIV RNA, QUANTITATIVE, PCR: HIV RNA QNT RSLT: NOT DETECTED

## 2020-04-08 MED ORDER — DOXYCYCLINE MONOHYDRATE 100 MG CAPSULE
ORAL_CAPSULE | Freq: Two times a day (BID) | ORAL | 0 refills | 7.00000 days
Start: 2020-04-08 — End: 2020-04-15

## 2020-04-09 DIAGNOSIS — A563 Chlamydial infection of anus and rectum: Principal | ICD-10-CM

## 2020-04-09 MED ORDER — DOXYCYCLINE MONOHYDRATE 100 MG CAPSULE
ORAL_CAPSULE | Freq: Two times a day (BID) | ORAL | 0 refills | 7.00000 days | Status: CP
Start: 2020-04-09 — End: 2020-04-16

## 2020-04-09 MED FILL — AMLODIPINE 10 MG TABLET: 30 days supply | Qty: 30 | Fill #0 | Status: AC

## 2020-04-09 MED FILL — ATORVASTATIN 20 MG TABLET: 30 days supply | Qty: 30 | Fill #0 | Status: AC

## 2020-04-23 NOTE — Unmapped (Signed)
[facility] Refill Coordination Note    Specialty Medication(s) to be Shipped:   Infectious Disease: Descovy and Pifeltro    Other medication(s) to be shipped: No additional medications requested for fill at this time     Gary Campbell, DOB: 06/09/1971  Phone: [phone] (home)       All above HIPAA information was verified with patient.     Was a Nurse, learning disability used for this call? No    Completed refill call assessment today to schedule patient's medication shipment from the [facility] [phone]).       Specialty medication(s) and dose(s) confirmed: Regimen is correct and unchanged.   Changes to medications: Gary Campbell reports no changes at this time.  Changes to insurance: No  Questions for the pharmacist: No    Confirmed patient received Welcome Packet with first shipment. The patient will receive a drug information handout for each medication shipped and additional FDA Medication Guides as required.       DISEASE/MEDICATION-SPECIFIC INFORMATION        N/A    SPECIALTY MEDICATION ADHERENCE     Medication Adherence    Patient reported X missed doses in the last month: 0  Specialty Medication: descovy 200-25mg   Patient is on additional specialty medications: Yes  Additional Specialty Medications: pifeltro 100mg   Patient is on more than two specialty medications: No  Any gaps in refill history greater than 2 weeks in the last 3 months: no            descovy : 6 days of medicine on hand   pifeltro : 6 days of medicine on hand        SHIPPING     Shipping address confirmed in Epic.     Delivery Scheduled: Yes, Expected medication delivery date: 04/27/20.     Medication will be delivered via UPS to the prescription address in Epic WAM.    Gary Campbell   [facility] Specialty Technician

## 2020-04-26 MED FILL — PIFELTRO 100 MG TABLET: ORAL | 30 days supply | Qty: 30 | Fill #3

## 2020-04-26 MED FILL — DESCOVY 200 MG-25 MG TABLET: 30 days supply | Qty: 30 | Fill #5 | Status: AC

## 2020-04-26 MED FILL — PIFELTRO 100 MG TABLET: 30 days supply | Qty: 30 | Fill #3 | Status: AC

## 2020-04-26 MED FILL — DESCOVY 200 MG-25 MG TABLET: ORAL | 30 days supply | Qty: 30 | Fill #5

## 2020-05-21 NOTE — Unmapped (Signed)
[facility] Clinical Assessment & Refill Coordination Note    Gary Campbell, DOB: 11/16/1970  Phone: [phone] (home)     All above HIPAA information was verified with patient.     Was a Nurse, learning disability used for this call? No    Specialty Medication(s):   Infectious Disease: Descovy and Pifeltro     Current Outpatient Medications   Medication Sig Dispense Refill   ??? losartan (COZAAR) 50 MG tablet Take 50 mg by mouth daily.     ??? atorvastatin (LIPITOR) 20 MG tablet Take 1 tablet (20 mg total) by mouth daily. 30 tablet 11   ??? doravirine 100 mg Tab Take 1 tablet (100 mg total) by mouth daily. 30 tablet 6   ??? emtricitabine-tenofovir alafen (DESCOVY) 200-25 mg tablet Take 1 tablet by mouth daily. 30 tablet 6   ??? hydroCHLOROthiazide (HYDRODIURIL) 25 MG tablet Take 1 tablet (25 mg total) by mouth daily. 90 tablet 10   ??? ibuprofen (ADVIL,MOTRIN) 200 MG tablet Take 400 mg by mouth every six (6) hours as needed for pain.     ??? sildenafiL (VIAGRA) 100 MG tablet Take 1 tablet (100 mg total) by mouth once as needed for erectile dysfunction for up to 1 dose. 1 tablet 6     No current facility-administered medications for this visit.        Changes to medications: Gary Campbell Reports stopping the following medications: amlodipine and starting losartan    Allergies   Allergen Reactions   ??? Ace Inhibitors Cough       Changes to allergies: No    SPECIALTY MEDICATION ADHERENCE     Descovy   : 7 days of medicine on hand   Pifeltro 100 mg: 7 days of medicine on hand     Medication Adherence    Patient reported X missed doses in the last month: 0  Specialty Medication: Descovy  Patient is on additional specialty medications: Yes  Additional Specialty Medications: Pifeltro 100mg   Patient Reported Additional Medication X Missed Doses in the Last Month: 0  Patient is on more than two specialty medications: No  Any gaps in refill history greater than 2 weeks in the last 3 months: no  Demonstrates understanding of importance of adherence: yes  Informant: patient  Provider-estimated medication adherence level: good  Patient is at risk for Non-Adherence: No          Specialty medication(s) dose(s) confirmed: Regimen is correct and unchanged.     Are there any concerns with adherence? No    Adherence counseling provided? Not needed    CLINICAL MANAGEMENT AND INTERVENTION      Clinical Benefit Assessment:    Do you feel the medicine is effective or helping your condition? Yes     HIV ASSOCIATED LABS:     Lab Results   Component Value Date/Time    HIVRS Not Detected 04/05/2020 10:23 AM    HIVRS Detected (A) 09/29/2019 10:12 AM    HIVRS Detected (A) 07/08/2018 09:18 AM    HIVCP <40 (H) 09/29/2019 10:12 AM    HIVCP <40 03/18/2019 09:16 AM    HIVCP <40 (H) 07/08/2018 09:18 AM    HIVCP <40 (H) 01/07/2018 09:35 AM    ACD4 437 (L) 04/05/2020 10:23 AM    ACD4 432 (L) 07/08/2018 09:18 AM    ACD4 316 (L) 01/07/2018 09:35 AM       Clinical Benefit counseling provided? Labs from 04/05/20 show evidence of clinical benefit  Adverse Effects Assessment:    Are you experiencing any side effects? No    Are you experiencing difficulty administering your medicine? No    Quality of Life Assessment:    How many days over the past month did your HIV  keep you from your normal activities? For example, brushing your teeth or getting up in the morning. 0    Have you discussed this with your provider? Not needed    Therapy Appropriateness:    Is therapy appropriate? Yes, therapy is appropriate and should be continued    DISEASE/MEDICATION-SPECIFIC INFORMATION      N/A    PATIENT SPECIFIC NEEDS     - Does the patient have any physical, cognitive, or cultural barriers? No    - Is the patient high risk? No    - Does the patient require a Care Management Plan? No     - Does the patient require physician intervention or other additional services (i.e. nutrition, smoking cessation, social work)? No      SHIPPING     Specialty Medication(s) to be Shipped: Infectious Disease: Descovy and Pifeltro    Other medication(s) to be shipped: No additional medications requested for fill at this time     Changes to insurance: No    Delivery Scheduled: Yes, Expected medication delivery date: 05/25/20.     Medication will be delivered via UPS to the confirmed prescription address in [facility].    The patient will receive a drug information handout for each medication shipped and additional FDA Medication Guides as required.  Verified that patient has previously received a Conservation officer, historic buildings.    All of the patient's questions and concerns have been addressed.    Roderic Palau   [facility] Specialty Pharmacist

## 2020-05-24 MED FILL — PIFELTRO 100 MG TABLET: 30 days supply | Qty: 30 | Fill #4 | Status: AC

## 2020-05-24 MED FILL — PIFELTRO 100 MG TABLET: ORAL | 30 days supply | Qty: 30 | Fill #4

## 2020-05-24 MED FILL — DESCOVY 200 MG-25 MG TABLET: ORAL | 30 days supply | Qty: 30 | Fill #6

## 2020-05-24 MED FILL — DESCOVY 200 MG-25 MG TABLET: 30 days supply | Qty: 30 | Fill #6 | Status: AC

## 2020-06-17 DIAGNOSIS — B20 Human immunodeficiency virus [HIV] disease: Principal | ICD-10-CM

## 2020-06-17 MED ORDER — DESCOVY 200 MG-25 MG TABLET
ORAL_TABLET | Freq: Every day | ORAL | 6 refills | 30.00000 days | Status: CN
Start: 2020-06-17 — End: ?

## 2020-06-17 NOTE — Unmapped (Signed)
[facility] Refill Coordination Note    Specialty Medication(s) to be Shipped:   Infectious Disease: Descovy and Pifeltro    Other medication(s) to be shipped: No additional medications requested for fill at this time     Gary Campbell, DOB: Oct 14, 1970  Phone: [phone] (home)       All above HIPAA information was verified with patient.     Was a Nurse, learning disability used for this call? No    Completed refill call assessment today to schedule patient's medication shipment from the [facility] [phone]).       Specialty medication(s) and dose(s) confirmed: Regimen is correct and unchanged.   Changes to medications: [name] reports no changes at this time.  Changes to insurance: No  Questions for the pharmacist: No    Confirmed patient received Welcome Packet with first shipment. The patient will receive a drug information handout for each medication shipped and additional FDA Medication Guides as required.       DISEASE/MEDICATION-SPECIFIC INFORMATION        N/A    SPECIALTY MEDICATION ADHERENCE     Medication Adherence    Patient reported X missed doses in the last month: 0  Specialty Medication: descovy 200-25mg   Patient is on additional specialty medications: Yes  Additional Specialty Medications: pifeltro 100mg   Patient Reported Additional Medication X Missed Doses in the Last Month: 0  Patient is on more than two specialty medications: No  Any gaps in refill history greater than 2 weeks in the last 3 months: no  Demonstrates understanding of importance of adherence: yes  Informant: patient  Reliability of informant: reliable  Provider-estimated medication adherence level: good  Patient is at risk for Non-Adherence: No                pilfetro 100 mg: 10 days of medicine on hand   descovy 200-25 mg: 10 days of medicine on hand         SHIPPING     Shipping address confirmed in Epic.     Delivery Scheduled: Yes, Expected medication delivery date: 01/11.  However, Rx request for refills was sent to the provider as there are none remaining.     Medication will be delivered via UPS to the prescription address in Epic WAM.    [name]   [facility] Specialty Technician

## 2020-06-20 DIAGNOSIS — B20 Human immunodeficiency virus [HIV] disease: Principal | ICD-10-CM

## 2020-06-21 DIAGNOSIS — B182 Chronic viral hepatitis C: Principal | ICD-10-CM

## 2020-06-21 DIAGNOSIS — B20 Human immunodeficiency virus [HIV] disease: Principal | ICD-10-CM

## 2020-06-21 MED ORDER — DESCOVY 200 MG-25 MG TABLET
ORAL_TABLET | Freq: Every day | ORAL | 6 refills | 30 days | Status: CP
Start: 2020-06-21 — End: ?
  Filled 2020-06-21: qty 30, 30d supply, fill #0

## 2020-06-21 MED FILL — PIFELTRO 100 MG TABLET: ORAL | 30 days supply | Qty: 30 | Fill #5

## 2020-07-15 NOTE — Unmapped (Signed)
[facility] Special[facility] Pharm[facility] Refill Coordination Note    Specialty Medication(s) to be Shipped:   Infectious Disease: Descovy and Pifeltro    Other medication(s) to be shipped: No additional medications requested for fill at this time     Gary Campbell, DOB: [birth year]  Phone: [phone] (home)       All above HIPAA information was verified with patient.     Was a Nurse, learning disability used for this call? No    Completed refill call assessment today to schedule patient's medication shipment from the [facility] [phone]).       Specialty medication(s) and dose(s) confirmed: Regimen is correct and unchanged.   Changes to medications: [name] reports no changes at this time.  Changes to insurance: No  Questions for the pharmacist: No    Confirmed patient received Welcome Packet with first shipment. The patient will receive a drug information handout for each medication shipped and additional FDA Medication Guides as required.       DISEASE/MEDICATION-SPECIFIC INFORMATION        N/A    SPECIALTY MEDICATION ADHERENCE     Medication Adherence    Patient reported X missed doses in the last month: 0  Specialty Medication: descovy 200-25mg   Patient is on additional specialty medications: Yes  Additional Specialty Medications: pilfetro 100mg   Patient Reported Additional Medication X Missed Doses in the Last Month: 0  Patient is on more than two specialty medications: No  Any gaps in refill history greater than 2 weeks in the last 3 months: no  Demonstrates understanding of importance of adherence: yes  Informant: patient  Reliability of informant: reliable  Provider-estimated medication adherence level: good  Patient is at risk for Non-Adherence: No                pilfetro 100 mg: 7 days of medicine on hand   descovy 200-25 mg: 7 days of medicine on hand         SHIPPING     Shipping address confirmed in Epic.     Delivery Scheduled: Yes, Expected medication delivery date: 02/07.     Medication will be delivered via UPS to the prescription address in Epic WAM.    [name]   [facility] Specialty Technician

## 2020-07-16 MED FILL — PIFELTRO 100 MG TABLET: ORAL | 30 days supply | Qty: 30 | Fill #6

## 2020-07-16 MED FILL — DESCOVY 200 MG-25 MG TABLET: ORAL | 30 days supply | Qty: 30 | Fill #1

## 2020-08-11 DIAGNOSIS — B20 Human immunodeficiency virus [HIV] disease: Principal | ICD-10-CM

## 2020-08-11 MED ORDER — PIFELTRO 100 MG TABLET
ORAL_TABLET | Freq: Every day | ORAL | 6 refills | 30.00000 days | Status: CN
Start: 2020-08-11 — End: ?

## 2020-08-11 NOTE — Unmapped (Signed)
[facility] Pharm[facility] Refill Coordination Note    Specialty Medication(s) to be Shipped:   Infectious Disease: Descovy and Pifeltro    Other medication(s) to be shipped: No additional medications requested for fill at this time     Gary Campbell, DOB: [birth year]  Phone: [phone] (home)       All above HIPAA information was verified with patient.     Was a Nurse, learning disability used for this call? No    Completed refill call assessment today to schedule patient's medication shipment from the [facility] [phone]).       Specialty medication(s) and dose(s) confirmed: Regimen is correct and unchanged.   Changes to medications: Gary Campbell reports no changes at this time.  Changes to insurance: No  Questions for the pharmacist: No    Confirmed patient received Welcome Packet with first shipment. The patient will receive a drug information handout for each medication shipped and additional FDA Medication Guides as required.       DISEASE/MEDICATION-SPECIFIC INFORMATION        N/A    SPECIALTY MEDICATION ADHERENCE     Medication Adherence    Patient reported X missed doses in the last month: 0  Specialty Medication: descovy 200-25mg   Patient is on additional specialty medications: Yes  Additional Specialty Medications:  pifeltro 100mg   Patient Reported Additional Medication X Missed Doses in the Last Month: 0  Patient is on more than two specialty medications: No  Any gaps in refill history greater than 2 weeks in the last 3 months: no  Demonstrates understanding of importance of adherence: yes  Informant: patient  Reliability of informant: reliable  Provider-estimated medication adherence level: good  Patient is at risk for Non-Adherence: No                descovy 200-25   mg: 10 days of medicine on hand   pifeltro 100 mg: 10 days of medicine on hand         SHIPPING     Shipping address confirmed in Epic.     Delivery Scheduled: Yes, Expected medication delivery date: 03/07.     Medication will be delivered via UPS to the prescription address in Epic WAM.    [name]   [facility] Specialty Technician

## 2020-08-13 DIAGNOSIS — B20 Human immunodeficiency virus [HIV] disease: Principal | ICD-10-CM

## 2020-08-13 MED ORDER — PIFELTRO 100 MG TABLET
ORAL_TABLET | Freq: Every day | ORAL | 2 refills | 30.00000 days | Status: CP
Start: 2020-08-13 — End: ?
  Filled 2020-08-16: qty 30, 30d supply, fill #0

## 2020-08-13 NOTE — Unmapped (Signed)
Gary Campbell 's Pifeltro shipment will be delayed as a result of no refills remain on the prescription.      I have reached out to the patient and communicated the delay. We will call the patient back to reschedule the delivery upon resolution. We have not confirmed the new delivery date.

## 2020-08-14 NOTE — Unmapped (Signed)
Pifeltro refill    Last clinic visit 04/05/2020    Next scheduled visit 10/18/2020    Per last provider note patient prescribed Pifeltro and descovy by Dr. Servando Snare    Per protocol  #30 w/2 refills approved with [facility] shared services pharmacy.

## 2020-08-16 DIAGNOSIS — B20 Human immunodeficiency virus [HIV] disease: Principal | ICD-10-CM

## 2020-08-16 MED FILL — DESCOVY 200 MG-25 MG TABLET: ORAL | 30 days supply | Qty: 30 | Fill #2

## 2020-08-16 NOTE — Unmapped (Signed)
Gary Campbell 's Pifeltro shipment will be sent out  as a result of a new prescription for the medication has been received.      I have reached out to the patient and communicated the delivery change. We will reschedule the medication for the delivery date that the patient agreed upon.  We have confirmed the delivery date as 08/17/2020, via ups.

## 2020-09-09 NOTE — Unmapped (Signed)
[facility] Refill Coordination Note    Specialty Medication(s) to be Shipped:   Infectious Disease: Descovy and Pifeltro    Other medication(s) to be shipped: No additional medications requested for fill at this time     [name], DOB: January 28, 1971  Phone: [phone] (home)       All above HIPAA information was verified with patient.     Was a Nurse, learning disability used for this call? No    Completed refill call assessment today to schedule patient's medication shipment from the [facility] [phone]).       Specialty medication(s) and dose(s) confirmed: Regimen is correct and unchanged.   Changes to medications: [name] reports no changes at this time.  Changes to insurance: No  Questions for the pharmacist: No    Confirmed patient received a Conservation officer, historic buildings and a Surveyor, mining with first shipment. The patient will receive a drug information handout for each medication shipped and additional FDA Medication Guides as required.       DISEASE/MEDICATION-SPECIFIC INFORMATION        N/A    SPECIALTY MEDICATION ADHERENCE     Medication Adherence    Patient reported X missed doses in the last month: 0  Specialty Medication: descovy  Patient is on additional specialty medications: Yes  Additional Specialty Medications: pifeltro  Patient Reported Additional Medication X Missed Doses in the Last Month: 0          Unable to confirm quantity on hand       SHIPPING     Shipping address confirmed in Epic.     Delivery Scheduled: Yes, Expected medication delivery date: 4/5.     Medication will be delivered via UPS to the prescription address in Epic WAM.    [name]   [facility] Specialty Technician

## 2020-09-13 MED FILL — DESCOVY 200 MG-25 MG TABLET: ORAL | 30 days supply | Qty: 30 | Fill #3

## 2020-09-13 MED FILL — PIFELTRO 100 MG TABLET: ORAL | 30 days supply | Qty: 30 | Fill #1

## 2020-09-13 NOTE — Unmapped (Signed)
Gary Campbell 's entire shipment will be delayed as a result of the medication is too soon to refill until 04/30.     I have reached out to the patient  at [phone] and left a voicemail message.  We will wait for a call back from the patient to reschedule the delivery.  We have not confirmed the new delivery date.

## 2020-10-12 NOTE — Unmapped (Signed)
[facility] Refill Coordination Note    Specialty Medication(s) to be Shipped:   Infectious Disease: Descovy and Pifeltro    Other medication(s) to be shipped: No additional medications requested for fill at this time     Gary Campbell, DOB: 03-26-1971  Phone: [phone] (home)       All above HIPAA information was verified with patient.     Was a Nurse, learning disability used for this call? No    Completed refill call assessment today to schedule patient's medication shipment from the [facility] [phone]).  All relevant notes have been reviewed.     Specialty medication(s) and dose(s) confirmed: Regimen is correct and unchanged.   Changes to medications: [name] reports no changes at this time.  Changes to insurance: No  New side effects reported not previously addressed with a pharmacist or physician: None reported  Questions for the pharmacist: No    Confirmed patient received a Conservation officer, historic buildings and a Surveyor, mining with first shipment. The patient will receive a drug information handout for each medication shipped and additional FDA Medication Guides as required.       DISEASE/MEDICATION-SPECIFIC INFORMATION        N/A    SPECIALTY MEDICATION ADHERENCE     Medication Adherence    Patient reported X missed doses in the last month: 0  Specialty Medication: descovy  Patient is on additional specialty medications: Yes  Additional Specialty Medications: pifeltro  Patient Reported Additional Medication X Missed Doses in the Last Month: 0              Were doses missed due to medication being on hold? No    descovy 200-25 mg: 7 days of medicine on hand   pifetro 100 mg: 7 days of medicine on hand        REFERRAL TO PHARMACIST     Referral to the pharmacist: Not needed      [facility]     Shipping address confirmed in Epic.     Delivery Scheduled: Yes, Expected medication delivery date: 10/14/20.     Medication will be delivered via UPS to the prescription address in Epic WAM.    Unk Lightning   [facility] Specialty Technician

## 2020-10-13 MED FILL — PIFELTRO 100 MG TABLET: ORAL | 30 days supply | Qty: 30 | Fill #2

## 2020-10-13 MED FILL — DESCOVY 200 MG-25 MG TABLET: ORAL | 30 days supply | Qty: 30 | Fill #4

## 2020-10-17 MED ORDER — HYDROCHLOROTHIAZIDE 25 MG TABLET
ORAL_TABLET | 8 refills | 0 days
Start: 2020-10-17 — End: ?

## 2020-10-18 ENCOUNTER — Ambulatory Visit: Admit: 2020-10-18 | Payer: PRIVATE HEALTH INSURANCE | Attending: Infectious Disease | Primary: Infectious Disease

## 2020-10-20 MED ORDER — HYDROCHLOROTHIAZIDE 25 MG TABLET
ORAL_TABLET | 0 refills | 0.00000 days | Status: CP
Start: 2020-10-20 — End: ?

## 2020-10-20 NOTE — Unmapped (Signed)
Last office note from 04/05/20 reviewed and pt to continue hydrochlorothiazide 25mg . #90 with no refills escribed.

## 2020-11-09 DIAGNOSIS — B20 Human immunodeficiency virus [HIV] disease: Principal | ICD-10-CM

## 2020-11-09 MED ORDER — PIFELTRO 100 MG TABLET
ORAL_TABLET | Freq: Every day | ORAL | 2 refills | 30 days
Start: 2020-11-09 — End: ?

## 2020-11-12 MED ORDER — PIFELTRO 100 MG TABLET
ORAL_TABLET | Freq: Every day | ORAL | 11 refills | 30 days | Status: CP
Start: 2020-11-12 — End: ?
  Filled 2020-11-23: qty 30, 30d supply, fill #0

## 2020-11-22 NOTE — Unmapped (Signed)
[facility] Clinical Assessment & Refill Coordination Note    Gary Campbell, DOB: 06/05/1971  Phone: [phone] (home)     All above HIPAA information was verified with patient.     Was a Nurse, learning disability used for this call? No    Specialty Medication(s):   Infectious Disease: Descovy and Pifeltro     Current Outpatient Medications   Medication Sig Dispense Refill   ??? atorvastatin (LIPITOR) 20 MG tablet Take 1 tablet (20 mg total) by mouth daily. 30 tablet 11   ??? doravirine (PIFELTRO) 100 mg Tab Take 1 tablet (100 mg total) by mouth daily. 30 tablet 11   ??? emtricitabine-tenofovir alafen (DESCOVY) 200-25 mg tablet Take 1 tablet by mouth daily. 30 tablet 6   ??? hydroCHLOROthiazide (HYDRODIURIL) 25 MG tablet TAKE 1 TABLET BY MOUTH ONCE DAILY 90 tablet 0   ??? ibuprofen (ADVIL,MOTRIN) 200 MG tablet Take 400 mg by mouth every six (6) hours as needed for pain.     ??? losartan (COZAAR) 50 MG tablet Take 50 mg by mouth daily.     ??? sildenafiL (VIAGRA) 100 MG tablet Take 1 tablet (100 mg total) by mouth once as needed for erectile dysfunction for up to 1 dose. 1 tablet 6     No current facility-administered medications for this visit.        Changes to medications: [name] reports no changes at this time.    Allergies   Allergen Reactions   ??? Ace Inhibitors Cough       Changes to allergies: No    SPECIALTY MEDICATION ADHERENCE     Descovy 200-25 mg: approximately 14 days of medicine on hand   Pifeltro 100 mg: approximately 14 days of medicine on hand       Medication Adherence    Patient reported X missed doses in the last month: 0  Specialty Medication: Descovy 200-25mg   Patient is on additional specialty medications: Yes  Additional Specialty Medications: Pifeltro 100mg   Patient Reported Additional Medication X Missed Doses in the Last Month: 0  Patient is on more than two specialty medications: No  Demonstrates understanding of importance of adherence: yes  Informant: patient  Provider-estimated medication adherence level: good  Patient is at risk for Non-Adherence: No          Specialty medication(s) dose(s) confirmed: Regimen is correct and unchanged.     Are there any concerns with adherence? No    Adherence counseling provided? Not needed    CLINICAL MANAGEMENT AND INTERVENTION      Clinical Benefit Assessment:    Do you feel the medicine is effective or helping your condition? Yes    HIV ASSOCIATED LABS:     Lab Results   Component Value Date/Time    HIVRS Not Detected 04/05/2020 10:23 AM    HIVRS Detected (A) 09/29/2019 10:12 AM    HIVRS Detected (A) 07/08/2018 09:18 AM    HIVCP <40 (H) 09/29/2019 10:12 AM    HIVCP <40 03/18/2019 09:16 AM    HIVCP <40 (H) 07/08/2018 09:18 AM    HIVCP <40 (H) 01/07/2018 09:35 AM    ACD4 437 (L) 04/05/2020 10:23 AM    ACD4 432 (L) 07/08/2018 09:18 AM    ACD4 316 (L) 01/07/2018 09:35 AM       Clinical Benefit counseling provided? Labs from 04/05/20 show evidence of clinical benefit    Adverse Effects Assessment:    Are you experiencing any side effects? No  Are you experiencing difficulty administering your medicine? No    Quality of Life Assessment:    How many days over the past month did your HIV  keep you from your normal activities? For example, brushing your teeth or getting up in the morning. 0    Have you discussed this with your provider? Not needed    Acute Infection Status:    Acute infections noted within Epic:  No active infections  Patient reported infection: None    Therapy Appropriateness:    Is therapy appropriate? Yes, therapy is appropriate and should be continued    DISEASE/MEDICATION-SPECIFIC INFORMATION      N/A    PATIENT SPECIFIC NEEDS     - Does the patient have any physical, cognitive, or cultural barriers? No    - Is the patient high risk? No    - Does the patient require a Care Management Plan? No     - Does the patient require physician intervention or other additional services (i.e. nutrition, smoking cessation, social work)? No      SHIPPING     Specialty Medication(s) to be Shipped:   Infectious Disease: Descovy and Pifeltro    Other medication(s) to be shipped: No additional medications requested for fill at this time     Changes to insurance: No    Delivery Scheduled: Yes, Expected medication delivery date: 11/24/20.     Medication will be delivered via UPS to the confirmed prescription address in [facility].    The patient will receive a drug information handout for each medication shipped and additional FDA Medication Guides as required.  Verified that patient has previously received a Conservation officer, historic buildings and a Surveyor, mining.    The patient or caregiver noted above participated in the development of this care plan and knows that they can request review of or adjustments to the care plan at any time.      All of the patient's questions and concerns have been addressed.    Roderic Palau   [facility] Specialty Pharmacist

## 2020-11-23 MED FILL — DESCOVY 200 MG-25 MG TABLET: ORAL | 30 days supply | Qty: 30 | Fill #5

## 2021-01-03 MED ORDER — PIFELTRO 100 MG TABLET
ORAL_TABLET | 5 refills | 0 days
Start: 2021-01-03 — End: ?

## 2021-01-03 MED ORDER — DESCOVY 200 MG-25 MG TABLET
ORAL_TABLET | 5 refills | 0 days
Start: 2021-01-03 — End: ?

## 2021-01-04 NOTE — Unmapped (Signed)
[facility] Refill Coordination Note    Specialty Medication(s) to be Shipped:   Infectious Disease: Descovy and Pifeltro    Other medication(s) to be shipped: No additional medications requested for fill at this time     [name], DOB: 29-Oct-1970  Phone: [phone] (home)       All above HIPAA information was verified with patient.     Was a Nurse, learning disability used for this call? No    Completed refill call assessment today to schedule patient's medication shipment from the [facility] [phone]).  All relevant notes have been reviewed.     Specialty medication(s) and dose(s) confirmed: Regimen is correct and unchanged.   Changes to medications: [name] reports no changes at this time.  Changes to insurance: No  New side effects reported not previously addressed with a pharmacist or physician: None reported  Questions for the pharmacist: No    Confirmed patient received a Conservation officer, historic buildings and a Surveyor, mining with first shipment. The patient will receive a drug information handout for each medication shipped and additional FDA Medication Guides as required.       DISEASE/MEDICATION-SPECIFIC INFORMATION        N/A    SPECIALTY MEDICATION ADHERENCE     Medication Adherence    Patient reported X missed doses in the last month: 0  Specialty Medication: Descovy  Patient is on additional specialty medications: Yes  Additional Specialty Medications: Pifeltro  Patient Reported Additional Medication X Missed Doses in the Last Month: 0  Patient is on more than two specialty medications: No              Were doses missed due to medication being on hold? No    Unable to confirm quantity on hand    REFERRAL TO PHARMACIST     Referral to the pharmacist: Not needed      [facility]     Shipping address confirmed in Epic.     Delivery Scheduled: Yes, Expected medication delivery date: 7/28.     Medication will be delivered via UPS to the prescription address in Epic WAM.    [name]   [facility] Specialty Technician

## 2021-01-05 NOTE — Unmapped (Signed)
Delivery has been changed to 7/29 via ups to prescription address.

## 2021-01-06 MED FILL — DESCOVY 200 MG-25 MG TABLET: 30 days supply | Qty: 30 | Fill #0

## 2021-01-06 MED FILL — PIFELTRO 100 MG TABLET: 30 days supply | Qty: 30 | Fill #0

## 2021-01-28 NOTE — Unmapped (Signed)
[facility] Refill Coordination Note    Specialty Medication(s) to be Shipped:   Infectious Disease: Descovy and Pifeltro    Other medication(s) to be shipped: No additional medications requested for fill at this time     Gary Campbell, DOB: 1971/05/19  Phone: [phone] (home)       All above HIPAA information was verified with patient.     Was a Nurse, learning disability used for this call? No    Completed refill call assessment today to schedule patient's medication shipment from the [facility] [phone]).  All relevant notes have been reviewed.     Specialty medication(s) and dose(s) confirmed: Regimen is correct and unchanged.   Changes to medications: Gary Campbell reports no changes at this time.  Changes to insurance: No  New side effects reported not previously addressed with a pharmacist or physician: None reported  Questions for the pharmacist: No    Confirmed patient received a Conservation officer, historic buildings and a Surveyor, mining with first shipment. The patient will receive a drug information handout for each medication shipped and additional FDA Medication Guides as required.       DISEASE/MEDICATION-SPECIFIC INFORMATION        N/A    SPECIALTY MEDICATION ADHERENCE     Medication Adherence    Patient reported X missed doses in the last month: 0  Specialty Medication: Descovy  Patient is on additional specialty medications: Yes  Additional Specialty Medications: Pifeltro  Patient Reported Additional Medication X Missed Doses in the Last Month: 0  Patient is on more than two specialty medications: No  Any gaps in refill history greater than 2 weeks in the last 3 months: no  Demonstrates understanding of importance of adherence: yes  Informant: patient  Reliability of informant: reliable  Confirmed plan for next specialty medication refill: delivery by pharmacy  Refills needed for supportive medications: not needed              Were doses missed due to medication being on hold? No    Unable to confirm quantity on hand    REFERRAL TO PHARMACIST     Referral to the pharmacist: Not needed      [facility]     Shipping address confirmed in Epic.     Delivery Scheduled: Yes, Expected medication delivery date: 02/01/2021.     Medication will be delivered via UPS to the prescription address in Epic WAM.    Gary Campbell   [facility] Specialty Technician

## 2021-01-31 MED FILL — DESCOVY 200 MG-25 MG TABLET: 30 days supply | Qty: 30 | Fill #1

## 2021-01-31 MED FILL — PIFELTRO 100 MG TABLET: 30 days supply | Qty: 30 | Fill #1

## 2021-03-07 NOTE — Unmapped (Signed)
The [facility] has made a third and final attempt to reach this patient to refill the following medication: Descovy, Pifeltro.      We have left voicemails on the following phone numbers: [phone] and have sent a MyChart message.    Dates contacted: 9/14, 9/21, 9/26  Last scheduled delivery: shipped 8/22    The patient may be at risk of non-compliance with this medication. The patient should call the [facility] at [phone]  Option 4, then Option 2 (all other specialty patients) to refill medication.    [name]   [facility] Specialty Technician

## 2021-03-07 NOTE — Unmapped (Signed)
[facility] Refill Coordination Note    Specialty Medication(s) to be Shipped:   Infectious Disease: Descovy and Pifeltro    Other medication(s) to be shipped: No additional medications requested for fill at this time     Gary Campbell, DOB: 10-12-1970  Phone: [phone] (home)       All above HIPAA information was verified with patient.     Was a Nurse, learning disability used for this call? No    Completed refill call assessment today to schedule patient's medication shipment from the [facility] [phone]).  All relevant notes have been reviewed.     Specialty medication(s) and dose(s) confirmed: Regimen is correct and unchanged.   Changes to medications: Gary Campbell reports no changes at this time.  Changes to insurance: No  New side effects reported not previously addressed with a pharmacist or physician: None reported  Questions for the pharmacist: No    Confirmed patient received a Conservation officer, historic buildings and a Surveyor, mining with first shipment. The patient will receive a drug information handout for each medication shipped and additional FDA Medication Guides as required.       DISEASE/MEDICATION-SPECIFIC INFORMATION        N/A    SPECIALTY MEDICATION ADHERENCE     Medication Adherence    Patient reported X missed doses in the last month: 0  Specialty Medication: Descovy 200-25mg   Patient is on additional specialty medications: Yes  Additional Specialty Medications: Pifeltro 100mg   Patient Reported Additional Medication X Missed Doses in the Last Month: 0  Patient is on more than two specialty medications: No  Informant: patient              Were doses missed due to medication being on hold? No    Descovy 200-25 mg: 7 days of medicine on hand   Pifeltro 100 mg: 7 days of medicine on hand       REFERRAL TO PHARMACIST     Referral to the pharmacist: Not needed      [facility]     Shipping address confirmed in Epic.     Delivery Scheduled: Yes, Expected medication delivery date: 03/10/21. Medication will be delivered via UPS to the prescription address in Epic WAM.    Gary Campbell   [facility] Specialty Technician

## 2021-03-09 MED FILL — PIFELTRO 100 MG TABLET: 30 days supply | Qty: 30 | Fill #2

## 2021-03-09 MED FILL — DESCOVY 200 MG-25 MG TABLET: 30 days supply | Qty: 30 | Fill #2

## 2021-03-18 NOTE — Unmapped (Signed)
Patient is considered to be out of care.     Patient's last ID clinic appt: 04/05/2020    Patient's HIV Viral Load History:  04/05/2020 (40)    Linkage and Retention Coordinator attempted to call patient for patient's re-engagement in care. No contact was made with patient. Could not leave voicemail due to voicemail box being full.     Linkage and Retention Coordinator sent text message to patient to re-engage in care via WELL app.     ID Clinic Out of Care Patient - Bridge Counseling Care Plan     Goal:  Patient will have an office visit with provider visit within 6 months of last office visit unless stated otherwise by provider.     IInterventions may include:  ?? Send patient an out of care notification via letter and/or MyChart  ?? Sending patient an out of care text message  ?? Placing phone calls to patient and/or listed contacts   ?? Contacting pharmacies as needed for additional information  ?? Researching other online resources as needed     If patient is reached, the bridge counselor will attempt to:  ?? Discover the reason for missed appointments, no shows, or lack of scheduled follow-up appointment such as transportation, finances, child care, or competing needs  ?? Make appropriate referrals and link patient to clinic nurses, social workers, Artist or other support services in clinic to address concerns or barriers to care  ?? Send In [employer] to social workers or nurses if case management is needed  ?? Schedule a follow-up appointment at appropriate interval based on assessment  ?? Monitor attendance at the appointment and provide reminder calls if needed  ?? Continue follow-up as indicated     Expected Outcome:  Patient will have an office visit with provider visit within 6 months of last office visit unless stated otherwise by provider.     **If the patient is not able to be located or retained in HIV care a referral will be placed to the [facility] for follow-up.    Duration of Service: 7 minutes    Daria Mcmeekin  Linkage and Retention Coordinator  [facility] Infectious Diseases Clinic

## 2021-04-01 NOTE — Unmapped (Signed)
[facility] Refill Coordination Note    Specialty Medication(s) to be Shipped:   Infectious Disease: Descovy and Pifeltro    Other medication(s) to be shipped: No additional medications requested for fill at this time     Gary Campbell, DOB: 11/21/1970  Phone: [phone] (home)       All above HIPAA information was verified with patient.     Was a Nurse, learning disability used for this call? No    Completed refill call assessment today to schedule patient's medication shipment from the [facility] [phone]).  All relevant notes have been reviewed.     Specialty medication(s) and dose(s) confirmed: Regimen is correct and unchanged.   Changes to medications: Gary Campbell reports no changes at this time.  Changes to insurance: No  New side effects reported not previously addressed with a pharmacist or physician: None reported  Questions for the pharmacist: No    Confirmed patient received a Conservation officer, historic buildings and a Surveyor, mining with first shipment. The patient will receive a drug information handout for each medication shipped and additional FDA Medication Guides as required.       DISEASE/MEDICATION-SPECIFIC INFORMATION        N/A    SPECIALTY MEDICATION ADHERENCE     Medication Adherence    Patient reported X missed doses in the last month: 0  Specialty Medication: descovy 200-25mg   Patient is on additional specialty medications: Yes  Additional Specialty Medications: Pifeltro 100mg   Patient Reported Additional Medication X Missed Doses in the Last Month: 0  Patient is on more than two specialty medications: No  Any gaps in refill history greater than 2 weeks in the last 3 months: no  Demonstrates understanding of importance of adherence: yes  Informant: patient  Reliability of informant: reliable  Provider-estimated medication adherence level: good  Patient is at risk for Non-Adherence: No  Reasons for non-adherence: no problems identified              Were doses missed due to medication being on hold? No    pifeltro 100 mg: 8 days of medicine on hand   descovy 200-25 mg: 8 days of medicine on hand         REFERRAL TO PHARMACIST     Referral to the pharmacist: Not needed      [facility]     Shipping address confirmed in Epic.     Delivery Scheduled: Yes, Expected medication delivery date: 10/25.     Medication will be delivered via UPS to the prescription address in Epic WAM.    Gary Campbell   [facility] Specialty Technician

## 2021-04-01 NOTE — Unmapped (Signed)
Called patient to set up follow-up appointment with ID Clinic. Patient stated that they are now receiving care at [facility] for HIV management and will not be routinely followed by the ID clinic at [facility]    Duration of Service: 5 minutes    Adelia Baptista  Linkage and Retention Coordinator  [facility]

## 2021-04-04 MED FILL — DESCOVY 200 MG-25 MG TABLET: 30 days supply | Qty: 30 | Fill #3

## 2021-04-04 MED FILL — PIFELTRO 100 MG TABLET: 30 days supply | Qty: 30 | Fill #3

## 2021-05-09 NOTE — Unmapped (Addendum)
The [facility] has made a third and final attempt to reach this patient to refill the following medication: Descovy and Pifeltro.      We have been unable to leave messages on the following phone numbers: [phone] and have sent a MyChart message.    Dates contacted: 11/16, 1121, and 11/28  Last scheduled delivery: 04/05/21    The patient may be at risk of non-compliance with this medication. The patient should call the [facility] at [phone]  Option 4, then Option 2 (all other specialty patients) to refill medication.    Patient has transferred care to [facility] facility     Gary Campbell   [facility] Specialty Pharmacist

## 2021-06-01 DIAGNOSIS — B20 Human immunodeficiency virus [HIV] disease: Principal | ICD-10-CM

## 2021-06-01 NOTE — Unmapped (Signed)
Gary Campbell 's Descovy & Pifeltro shipment will be delayed as a result of prior authorization being required by the patient's insurance.     I have reached out to the patient  at [phone] and left a voicemail message.  We will call the patient back to reschedule the delivery upon resolution. We have not confirmed the new delivery date.

## 2021-06-01 NOTE — Unmapped (Signed)
[name] states that he had accumulated quite a bit of medication due to early refills and this is the reason for the gap in fill history. He denies missed doses.    [facility] Clinical Assessment & Refill Coordination Note    Gary Campbell, DOB: Jun 28, 1970  Phone: [phone] (home)     All above HIPAA information was verified with patient.     Was a Nurse, learning disability used for this call? No    Specialty Medication(s):   Infectious Disease: Descovy and Pifeltro     Current Outpatient Medications   Medication Sig Dispense Refill   ??? atorvastatin (LIPITOR) 20 MG tablet Take 1 tablet (20 mg total) by mouth daily. 30 tablet 11   ??? doravirine (PIFELTRO) 100 mg Tab Take 1 tablet (100 mg total) by mouth daily. 30 tablet 11   ??? doravirine (PIFELTRO) 100 mg Tab Take 1 tablet (100 mg total) by mouth daily. 30 tablet 5   ??? emtricitabine-tenofovir alafen (DESCOVY) 200-25 mg tablet Take 1 tablet by mouth daily. 30 tablet 6   ??? emtricitabine-tenofovir alafen (DESCOVY) 200-25 mg tablet Take 1 tablet by mouth daily. 30 tablet 5   ??? hydroCHLOROthiazide (HYDRODIURIL) 25 MG tablet TAKE 1 TABLET BY MOUTH ONCE DAILY 90 tablet 0   ??? ibuprofen (ADVIL,MOTRIN) 200 MG tablet Take 400 mg by mouth every six (6) hours as needed for pain.     ??? losartan (COZAAR) 50 MG tablet Take 50 mg by mouth daily.     ??? sildenafiL (VIAGRA) 100 MG tablet Take 1 tablet (100 mg total) by mouth once as needed for erectile dysfunction for up to 1 dose. 1 tablet 6     No current facility-administered medications for this visit.        Changes to medications: [name] reports no changes at this time.    Allergies   Allergen Reactions   ??? Ace Inhibitors Cough       Changes to allergies: No    SPECIALTY MEDICATION ADHERENCE     Descovy 200-25 mg: 3 days of medicine on hand   Pifeltro 100 mg: 3 days of medicine on hand       Medication Adherence    Patient reported X missed doses in the last month: 0  Specialty Medication: Descovy  Patient is on additional specialty medications: Yes  Additional Specialty Medications: Pifeltro  Patient Reported Additional Medication X Missed Doses in the Last Month: 0  Patient is on more than two specialty medications: No  Informant: patient          Specialty medication(s) dose(s) confirmed: Regimen is correct and unchanged.     Are there any concerns with adherence? No    Adherence counseling provided? Not needed    CLINICAL MANAGEMENT AND INTERVENTION      Clinical Benefit Assessment:    Do you feel the medicine is effective or helping your condition? Yes    Clinical Benefit counseling provided? Not needed    Adverse Effects Assessment:    Are you experiencing any side effects? No    Are you experiencing difficulty administering your medicine? No    Quality of Life Assessment:    Quality of Life    Rheumatology  Oncology  Dermatology  Cystic Fibrosis          How many days over the past month did your HIV  keep you from your normal activities? For example, brushing your teeth or getting up in the morning.  0    Have you discussed this with your provider? Not needed    Acute Infection Status:    Acute infections noted within Epic:  No active infections  Patient reported infection: None    Therapy Appropriateness:    Is therapy appropriate and patient progressing towards therapeutic goals? Yes, therapy is appropriate and should be continued    DISEASE/MEDICATION-SPECIFIC INFORMATION      N/A    PATIENT SPECIFIC NEEDS     - Does the patient have any physical, cognitive, or cultural barriers? No    - Is the patient high risk? No    - Does the patient require a Care Management Plan? No     SOCIAL DETERMINANTS OF HEALTH     At the [facility], we have learned that life circumstances - like trouble affording food, housing, utilities, or transportation can affect the health of many of our patients.   That is why we wanted to ask: are you currently experiencing any life circumstances that are negatively impacting your health and/or quality of life? Patient declined to answer    Social Determinants of Health     Food Insecurity: Not on file   Tobacco Use: Not on file   Transportation Needs: Not on file   Alcohol Use: Not on file   Housing/Utilities: Not on file   Substance Use: Not on file   Financial Resource Strain: Not on file   Physical Activity: Not on file   Health Literacy: Not on file   Stress: Not on file   Intimate Partner Violence: Not on file   Depression: Not on file   Social Connections: Not on file       Would you be willing to receive help with any of the needs that you have identified today? Not applicable       SHIPPING     Specialty Medication(s) to be Shipped:   Infectious Disease: Descovy and Pifeltro    Other medication(s) to be shipped: No additional medications requested for fill at this time     Changes to insurance: No    Delivery Scheduled: Yes, Expected medication delivery date: 06/02/21.     Medication will be delivered via UPS to the confirmed prescription address in [facility].    The patient will receive a drug information handout for each medication shipped and additional FDA Medication Guides as required.  Verified that patient has previously received a Conservation officer, historic buildings and a Surveyor, mining.    The patient or caregiver noted above participated in the development of this care plan and knows that they can request review of or adjustments to the care plan at any time.      All of the patient's questions and concerns have been addressed.    Arnold Long   [facility] Specialty Pharmacist

## 2021-06-07 MED FILL — PIFELTRO 100 MG TABLET: 30 days supply | Qty: 30 | Fill #4

## 2021-06-07 MED FILL — DESCOVY 200 MG-25 MG TABLET: 30 days supply | Qty: 30 | Fill #4

## 2021-06-07 NOTE — Unmapped (Signed)
Spoke with patient. His delivery of Descovy and Pifeltro is now scheduled for 12/28 via UPS to prescription address. Patient has no questions for the pharmacist.

## 2021-06-30 NOTE — Unmapped (Signed)
[facility] Refill Coordination Note    Specialty Medication(s) to be Shipped:   Infectious Disease: Descovy and Pifeltro    Other medication(s) to be shipped: No additional medications requested for fill at this time     [name], DOB: 05/27/1971  Phone: [phone] (home)       All above HIPAA information was verified with patient.     Was a Nurse, learning disability used for this call? No    Completed refill call assessment today to schedule patient's medication shipment from the [facility] [phone]).  All relevant notes have been reviewed.     Specialty medication(s) and dose(s) confirmed: Regimen is correct and unchanged.   Changes to medications: [name] reports no changes at this time.  Changes to insurance: No  New side effects reported not previously addressed with a pharmacist or physician: None reported  Questions for the pharmacist: No    Confirmed patient received a Conservation officer, historic buildings and a Surveyor, mining with first shipment. The patient will receive a drug information handout for each medication shipped and additional FDA Medication Guides as required.       DISEASE/MEDICATION-SPECIFIC INFORMATION        N/A    SPECIALTY MEDICATION ADHERENCE     Medication Adherence    Patient reported X missed doses in the last month: 0  Specialty Medication: descovy  Patient is on additional specialty medications: Yes  Additional Specialty Medications: pifeltro  Patient Reported Additional Medication X Missed Doses in the Last Month: 0              Were doses missed due to medication being on hold? No    Unable to confirm quantity on hand    REFERRAL TO PHARMACIST     Referral to the pharmacist: Not needed      [facility]     Shipping address confirmed in Epic.     Delivery Scheduled: Yes, Expected medication delivery date: 1/24.     Medication will be delivered via UPS to the prescription address in Epic [facility].    [name]   [facility] Specialty Technician

## 2021-07-04 DIAGNOSIS — B20 Human immunodeficiency virus [HIV] disease: Principal | ICD-10-CM

## 2021-07-04 MED FILL — PIFELTRO 100 MG TABLET: 30 days supply | Qty: 30 | Fill #5

## 2021-07-04 MED FILL — DESCOVY 200 MG-25 MG TABLET: 30 days supply | Qty: 30 | Fill #5

## 2021-07-26 MED ORDER — DESCOVY 200 MG-25 MG TABLET
ORAL_TABLET | 5 refills | 0 days
Start: 2021-07-26 — End: ?

## 2021-07-27 MED ORDER — EMTRICITABINE 200 MG-TENOFOVIR ALAFENAMIDE FUMARATE 25 MG TABLET
ORAL_TABLET | 5 refills | 0 days
Start: 2021-07-27 — End: ?

## 2021-07-27 MED ORDER — DESCOVY 200 MG-25 MG TABLET
ORAL_TABLET | Freq: Every day | ORAL | 3 refills | 30 days
Start: 2021-07-27 — End: ?

## 2021-07-27 NOTE — Unmapped (Signed)
[facility] Refill Coordination Note    Specialty Medication(s) to be Shipped:   Infectious Disease: Descovy and Pifeltro    Other medication(s) to be shipped: No additional medications requested for fill at this time     Gary Campbell, DOB: 15-Aug-1970  Phone: [phone] (home)       All above HIPAA information was verified with patient.     Was a Nurse, learning disability used for this call? No    Completed refill call assessment today to schedule patient's medication shipment from the [facility] [phone]).  All relevant notes have been reviewed.     Specialty medication(s) and dose(s) confirmed: Regimen is correct and unchanged.   Changes to medications: Gary Campbell reports no changes at this time.  Changes to insurance: No  New side effects reported not previously addressed with a pharmacist or physician: None reported  Questions for the pharmacist: No    Confirmed patient received a Conservation officer, historic buildings and a Surveyor, mining with first shipment. The patient will receive a drug information handout for each medication shipped and additional FDA Medication Guides as required.       DISEASE/MEDICATION-SPECIFIC INFORMATION        N/A    SPECIALTY MEDICATION ADHERENCE     Medication Adherence    Patient reported X missed doses in the last month: 0  Specialty Medication: DESCOVY 200-25 mg tablet (emtricitabine-tenofovir alafen)  Patient is on additional specialty medications: Yes  Additional Specialty Medications: PIFELTRO 100 mg Tab (doravirine)  Patient Reported Additional Medication X Missed Doses in the Last Month: 0  Patient is on more than two specialty medications: No              Were doses missed due to medication being on hold? No     Descovy   8 days worth of medication on hand.  Pifeltro  .8 days worth of medication on hand.      REFERRAL TO PHARMACIST     Referral to the pharmacist: Not needed      [facility]     Shipping address confirmed in Epic.     Delivery Scheduled: Yes, Expected medication delivery date: 08/02/21.     Medication will be delivered via UPS to the prescription address in Epic [facility].    Gary Campbell   [facility] Specialty Technician

## 2021-08-01 MED FILL — DESCOVY 200 MG-25 MG TABLET: ORAL | 30 days supply | Qty: 30 | Fill #0

## 2021-08-01 MED FILL — PIFELTRO 100 MG TABLET: ORAL | 30 days supply | Qty: 30 | Fill #1

## 2021-08-26 NOTE — Unmapped (Signed)
[facility] Refill Coordination Note    Specialty Medication(s) to be Shipped:   Infectious Disease: Descovy and Pifeltro    Other medication(s) to be shipped: No additional medications requested for fill at this time     [name], DOB: 03/24/1971  Phone: [phone] (home)       All above HIPAA information was verified with patient.     Was a Nurse, learning disability used for this call? No    Completed refill call assessment today to schedule patient's medication shipment from the [facility] [phone]).  All relevant notes have been reviewed.     Specialty medication(s) and dose(s) confirmed: Regimen is correct and unchanged.   Changes to medications: [name] reports no changes at this time.  Changes to insurance: No  New side effects reported not previously addressed with a pharmacist or physician: None reported  Questions for the pharmacist: No    Confirmed patient received a Conservation officer, historic buildings and a Surveyor, mining with first shipment. The patient will receive a drug information handout for each medication shipped and additional FDA Medication Guides as required.       DISEASE/MEDICATION-SPECIFIC INFORMATION        N/A    SPECIALTY MEDICATION ADHERENCE     Medication Adherence    Patient reported X missed doses in the last month: 0  Specialty Medication: DESCOVY 200-25 mg tablet (emtricitabine-tenofovir alafen)  Patient is on additional specialty medications: Yes  Additional Specialty Medications: PIFELTRO 100 mg Tab (doravirine)  Patient Reported Additional Medication X Missed Doses in the Last Month: 0  Patient is on more than two specialty medications: No              Were doses missed due to medication being on hold? No    descovy 200-25 mg: 7 days of medicine on hand   pifeltro 100 mg: 7 days of medicine on hand        REFERRAL TO PHARMACIST     Referral to the pharmacist: Not needed      [facility]     Shipping address confirmed in Epic.     Delivery Scheduled: Yes, Expected medication delivery date: 08/31/21.     Medication will be delivered via UPS to the prescription address in Epic [facility].    Unk Lightning   [facility] Specialty Technician

## 2021-08-30 DIAGNOSIS — B20 Human immunodeficiency virus [HIV] disease: Principal | ICD-10-CM

## 2021-08-30 MED FILL — DESCOVY 200 MG-25 MG TABLET: ORAL | 30 days supply | Qty: 30 | Fill #1

## 2021-08-30 MED FILL — PIFELTRO 100 MG TABLET: ORAL | 30 days supply | Qty: 30 | Fill #2

## 2021-09-26 NOTE — Unmapped (Signed)
[facility] Refill Coordination Note    Specialty Medication(s) to be Shipped:   Infectious Disease: Descovy and Pifeltro    Other medication(s) to be shipped: No additional medications requested for fill at this time     Gary Campbell, DOB: 09-18-1970  Phone: [phone] (home)       All above HIPAA information was verified with patient.     Was a Nurse, learning disability used for this call? No    Completed refill call assessment today to schedule patient's medication shipment from the [facility] [phone]).  All relevant notes have been reviewed.     Specialty medication(s) and dose(s) confirmed: Regimen is correct and unchanged.   Changes to medications: Gary Campbell reports no changes at this time.  Changes to insurance: No  New side effects reported not previously addressed with a pharmacist or physician: None reported  Questions for the pharmacist: No    Confirmed patient received a Conservation officer, historic buildings and a Surveyor, mining with first shipment. The patient will receive a drug information handout for each medication shipped and additional FDA Medication Guides as required.       DISEASE/MEDICATION-SPECIFIC INFORMATION        N/A    SPECIALTY MEDICATION ADHERENCE     Medication Adherence    Patient reported X missed doses in the last month: 0  Specialty Medication: descovy  Patient is on additional specialty medications: Yes  Additional Specialty Medications: pifeltro  Patient Reported Additional Medication X Missed Doses in the Last Month: 0  Patient is on more than two specialty medications: No        Were doses missed due to medication being on hold? No    descovy 200-25 mg: 7 days of medicine on hand   pifeltro 100 mg: 7 days of medicine on hand      REFERRAL TO PHARMACIST     Referral to the pharmacist: Not needed      [facility]     Shipping address confirmed in Epic.     Delivery Scheduled: Yes, Expected medication delivery date: 09/29/2021.     Medication will be delivered via UPS to the prescription address in Epic [facility].    Gary Campbell [facility] Specialty Technician

## 2021-09-28 MED FILL — DESCOVY 200 MG-25 MG TABLET: ORAL | 30 days supply | Qty: 30 | Fill #2

## 2021-09-28 MED FILL — PIFELTRO 100 MG TABLET: ORAL | 30 days supply | Qty: 30 | Fill #3

## 2021-10-21 NOTE — Unmapped (Signed)
[facility] Refill Coordination Note    Specialty Medication(s) to be Shipped:   Infectious Disease: Descovy and Pifeltro    Other medication(s) to be shipped: No additional medications requested for fill at this time     Gary Campbell, DOB: July 18, 1970  Phone: [phone] (home)       All above HIPAA information was verified with patient.     Was a Nurse, learning disability used for this call? No    Completed refill call assessment today to schedule patient's medication shipment from the [facility] [phone]).  All relevant notes have been reviewed.     Specialty medication(s) and dose(s) confirmed: Regimen is correct and unchanged.   Changes to medications: Gary Campbell reports no changes at this time.  Changes to insurance: No  New side effects reported not previously addressed with a pharmacist or physician: None reported  Questions for the pharmacist: No    Confirmed patient received a Conservation officer, historic buildings and a Surveyor, mining with first shipment. The patient will receive a drug information handout for each medication shipped and additional FDA Medication Guides as required.       DISEASE/MEDICATION-SPECIFIC INFORMATION        N/A    SPECIALTY MEDICATION ADHERENCE     Medication Adherence    Patient reported X missed doses in the last month: 0  Specialty Medication: DESCOVY 200-25 mg  Patient is on additional specialty medications: Yes  Additional Specialty Medications: PIFELTRO 100 mg   Patient Reported Additional Medication X Missed Doses in the Last Month: 0  Patient is on more than two specialty medications: No  Informant: patient              Were doses missed due to medication being on hold? No    Descovy 200-25 mg: 7 days of medicine on hand   Pifeltro 100 mg: 7 days of medicine on hand     REFERRAL TO PHARMACIST     Referral to the pharmacist: Not needed      [facility]     Shipping address confirmed in Epic.     Delivery Scheduled: Yes, Expected medication delivery date: 10/25/21.     Medication will be delivered via UPS to the prescription address in Epic [facility].    [name]   [facility] Specialty Technician

## 2021-10-24 MED FILL — PIFELTRO 100 MG TABLET: ORAL | 30 days supply | Qty: 30 | Fill #4

## 2021-10-24 MED FILL — DESCOVY 200 MG-25 MG TABLET: ORAL | 30 days supply | Qty: 30 | Fill #3

## 2021-11-14 DIAGNOSIS — B20 Human immunodeficiency virus [HIV] disease: Principal | ICD-10-CM

## 2021-11-14 MED ORDER — DESCOVY 200 MG-25 MG TABLET
ORAL_TABLET | Freq: Every day | ORAL | 3 refills | 30.00000 days
Start: 2021-11-14 — End: ?

## 2021-11-14 MED ORDER — PIFELTRO 100 MG TABLET
ORAL_TABLET | Freq: Every day | ORAL | 11 refills | 30 days
Start: 2021-11-14 — End: ?

## 2021-11-14 NOTE — Unmapped (Signed)
[facility] Clinical Assessment & Refill Coordination Note    Gary Campbell, DOB: March 28, 1971  Phone: [phone] (home)     All above HIPAA information was verified with patient.     Was a Nurse, learning disability used for this call? No    Specialty Medication(s):   Infectious Disease: Descovy and Pifeltro     Current Outpatient Medications   Medication Sig Dispense Refill    atorvastatin (LIPITOR) 20 MG tablet Take 1 tablet (20 mg total) by mouth daily. 30 tablet 11    doravirine (PIFELTRO) 100 mg Tab Take 1 tablet (100 mg total) by mouth daily. 30 tablet 11    doravirine (PIFELTRO) 100 mg Tab Take 1 tablet (100 mg total) by mouth daily. 30 tablet 5    emtricitabine-tenofovir alafen (DESCOVY) 200-25 mg tablet Take 1 tablet by mouth daily. 30 tablet 6    emtricitabine-tenofovir alafen (DESCOVY) 200-25 mg tablet Take 1 tablet by mouth daily. 30 tablet 3    hydroCHLOROthiazide (HYDRODIURIL) 25 MG tablet TAKE 1 TABLET BY MOUTH ONCE DAILY 90 tablet 0    ibuprofen (ADVIL,MOTRIN) 200 MG tablet Take 400 mg by mouth every six (6) hours as needed for pain.      losartan (COZAAR) 50 MG tablet Take 50 mg by mouth daily.      sildenafiL (VIAGRA) 100 MG tablet Take 1 tablet (100 mg total) by mouth once as needed for erectile dysfunction for up to 1 dose. 1 tablet 6     No current facility-administered medications for this visit.        Changes to medications: [name] reports no changes at this time.    Allergies   Allergen Reactions    Ace Inhibitors Cough       Changes to allergies: No    SPECIALTY MEDICATION ADHERENCE     Descovy 200-25 mg: 7 days of medicine on hand   Pifeltro 100 mg: 7 days of medicine on hand       Medication Adherence    Patient reported X missed doses in the last month: 0  Specialty Medication: Descovy  Patient is on additional specialty medications: Yes  Additional Specialty Medications: Pifeltro  Patient Reported Additional Medication X Missed Doses in the Last Month: 0 Specialty medication(s) dose(s) confirmed: Regimen is correct and unchanged.     Are there any concerns with adherence? No    Adherence counseling provided? Not needed    CLINICAL MANAGEMENT AND INTERVENTION      Clinical Benefit Assessment:    Do you feel the medicine is effective or helping your condition? Yes    Clinical Benefit counseling provided? Not needed    Adverse Effects Assessment:    Are you experiencing any side effects? No    Are you experiencing difficulty administering your medicine? No    Quality of Life Assessment:    Quality of Life    Rheumatology  Oncology  Dermatology  Cystic Fibrosis          How many days over the past month did your HIV  keep you from your normal activities? For example, brushing your teeth or getting up in the morning. Patient declined to answer    Have you discussed this with your provider? Not needed    Acute Infection Status:    Acute infections noted within Epic:  No active infections  Patient reported infection: None    Therapy Appropriateness:    Is therapy appropriate and patient progressing towards therapeutic goals?  Yes, therapy is appropriate and should be continued    DISEASE/MEDICATION-SPECIFIC INFORMATION      N/A    PATIENT SPECIFIC NEEDS     Does the patient have any physical, cognitive, or cultural barriers? No    Is the patient high risk? No    Does the patient require a Care Management Plan? No     SOCIAL DETERMINANTS OF HEALTH     At the [facility], we have learned that life circumstances - like trouble affording food, housing, utilities, or transportation can affect the health of many of our patients.   That is why we wanted to ask: are you currently experiencing any life circumstances that are negatively impacting your health and/or quality of life? Patient declined to answer    Social Determinants of Health     Financial Resource Strain: Not on file   Internet Connectivity: Not on file   Food Insecurity: Not on file   Tobacco Use: Not on file   Housing/Utilities: Not on file   Alcohol Use: Not on file   Transportation Needs: Not on file   Substance Use: Not on file   Health Literacy: Not on file   Physical Activity: Not on file   Interpersonal Safety: Not on file   Stress: Not on file   Intimate Partner Violence: Not on file   Depression: Not on file   Social Connections: Not on file       Would you be willing to receive help with any of the needs that you have identified today? Not applicable       SHIPPING     Specialty Medication(s) to be Shipped:   Infectious Disease: Descovy and Pifeltro    Other medication(s) to be shipped: No additional medications requested for fill at this time     Changes to insurance: No    Delivery Scheduled: Yes, Expected medication delivery date: 6/9.     Medication will be delivered via UPS to the confirmed prescription address in [facility].    The patient will receive a drug information handout for each medication shipped and additional FDA Medication Guides as required.  Verified that patient has previously received a Conservation officer, historic buildings and a Surveyor, mining.    The patient or caregiver noted above participated in the development of this care plan and knows that they can request review of or adjustments to the care plan at any time.      All of the patient's questions and concerns have been addressed.    [name]   [facility] Specialty Pharmacist

## 2021-11-15 MED ORDER — PIFELTRO 100 MG TABLET
ORAL_TABLET | Freq: Every day | ORAL | 11 refills | 30.00000 days
Start: 2021-11-15 — End: ?

## 2021-11-16 DIAGNOSIS — B20 Human immunodeficiency virus [HIV] disease: Principal | ICD-10-CM

## 2021-11-17 DIAGNOSIS — B20 Human immunodeficiency virus [HIV] disease: Principal | ICD-10-CM

## 2021-11-17 NOTE — Unmapped (Signed)
Gary Campbell 's descovy & pifeltro shipment will be delayed as a result of no refills remain on the prescription.      I have reached out to the patient  at [phone] and was unable to leave a message. Sent delay txt.  We will call the patient back to reschedule the delivery upon resolution. We have not confirmed the new delivery date.

## 2021-11-18 MED ORDER — PIFELTRO 100 MG TABLET
ORAL_TABLET | Freq: Every day | ORAL | 5 refills | 30 days
Start: 2021-11-18 — End: ?

## 2021-11-18 MED ORDER — DESCOVY 200 MG-25 MG TABLET
ORAL_TABLET | Freq: Every day | ORAL | 5 refills | 30 days
Start: 2021-11-18 — End: ?

## 2021-11-23 MED FILL — PIFELTRO 100 MG TABLET: ORAL | 30 days supply | Qty: 30 | Fill #0

## 2021-11-23 MED FILL — DESCOVY 200 MG-25 MG TABLET: ORAL | 30 days supply | Qty: 30 | Fill #0

## 2021-11-23 NOTE — Unmapped (Signed)
Patient called in and we have confirmed the new delivery date as 6/15 via UPS.

## 2021-12-12 NOTE — Unmapped (Signed)
[facility] Refill Coordination Note    Specialty Medication(s) to be Shipped:   Infectious Disease: Biktarvy and Pifeltro    Other medication(s) to be shipped: No additional medications requested for fill at this time     Gary Campbell, DOB: 1971-02-16  Phone: [phone] (home)       All above HIPAA information was verified with patient.     Was a Nurse, learning disability used for this call? No    Completed refill call assessment today to schedule patient's medication shipment from the [facility] [phone]).  All relevant notes have been reviewed.     Specialty medication(s) and dose(s) confirmed: Regimen is correct and unchanged.   Changes to medications: [name] reports no changes at this time.  Changes to insurance: No  New side effects reported not previously addressed with a pharmacist or physician: None reported  Questions for the pharmacist: No    Confirmed patient received a Conservation officer, historic buildings and a Surveyor, mining with first shipment. The patient will receive a drug information handout for each medication shipped and additional FDA Medication Guides as required.       DISEASE/MEDICATION-SPECIFIC INFORMATION        N/A    SPECIALTY MEDICATION ADHERENCE     Medication Adherence    Patient reported X missed doses in the last month: 0  Specialty Medication: biktarvy  Patient is on additional specialty medications: Yes  Additional Specialty Medications: pifeltro  Patient Reported Additional Medication X Missed Doses in the Last Month: 0              Were doses missed due to medication being on hold? No    Unable to confirm quantity on hand    REFERRAL TO PHARMACIST     Referral to the pharmacist: Not needed      [facility]     Shipping address confirmed in Epic.     Delivery Scheduled: Yes, Expected medication delivery date: 7/11.     Medication will be delivered via UPS to the prescription address in Epic WAM.    [name]   [facility] Specialty Technician

## 2021-12-19 NOTE — Unmapped (Signed)
Gary Campbell 's biktarvy and pifeltro shipment will be canceled  as a result of the medication is too soon to refill until 8/5.     I have reached out to the patient  at [phone] and left a voicemail message.  We will not reschedule the medication and have removed this/these medication(s) from the work request.  We have canceled this work request.     Last filled/shipped on 7/7 through [facility].

## 2022-01-20 MED FILL — PIFELTRO 100 MG TABLET: ORAL | 30 days supply | Qty: 30 | Fill #1

## 2022-01-20 MED FILL — DESCOVY 200 MG-25 MG TABLET: ORAL | 30 days supply | Qty: 30 | Fill #1

## 2022-01-20 NOTE — Unmapped (Signed)
[facility] Refill Coordination Note    Specialty Medication(s) to be Shipped:   Infectious Disease: Descovy and Pifeltro    Other medication(s) to be shipped: No additional medications requested for fill at this time     [name], DOB: 01-May-1971  Phone: [phone] (home)       All above HIPAA information was verified with patient.     Was a Nurse, learning disability used for this call? No    Completed refill call assessment today to schedule patient's medication shipment from the [facility] [phone]).  All relevant notes have been reviewed.     Specialty medication(s) and dose(s) confirmed: Regimen is correct and unchanged.   Changes to medications: [name] reports no changes at this time.  Changes to insurance: No  New side effects reported not previously addressed with a pharmacist or physician: None reported  Questions for the pharmacist: No    Confirmed patient received a Conservation officer, historic buildings and a Surveyor, mining with first shipment. The patient will receive a drug information handout for each medication shipped and additional FDA Medication Guides as required.       DISEASE/MEDICATION-SPECIFIC INFORMATION        N/A    SPECIALTY MEDICATION ADHERENCE     Medication Adherence    Patient reported X missed doses in the last month: 0  Specialty Medication: Descovy  Patient is on additional specialty medications: Yes  Additional Specialty Medications: Pifeltro  Patient Reported Additional Medication X Missed Doses in the Last Month: 0                          Were doses missed due to medication being on hold? No    descovy 200-25mg   : 10 days of medicine on hand   pifeltro 100mg   : 10 days of medicine on hand       REFERRAL TO PHARMACIST     Referral to the pharmacist: Not needed      [facility]     Shipping address confirmed in Epic.     Delivery Scheduled: Yes, Expected medication delivery date: 8/14.     Medication will be delivered via UPS to the prescription address in Epic WAM.    [name]   [facility] Specialty Technician

## 2022-02-23 NOTE — Unmapped (Signed)
[facility] Refill Coordination Note    Specialty Medication(s) to be Shipped:   Infectious Disease: Descovy and Pifeltro    Other medication(s) to be shipped: No additional medications requested for fill at this time     Gary Campbell, DOB: [birth year]  Phone: [phone] (home)       All above HIPAA information was verified with patient.     Was a Nurse, learning disability used for this call? No    Completed refill call assessment today to schedule patient's medication shipment from the [facility] [phone]).  All relevant notes have been reviewed.     Specialty medication(s) and dose(s) confirmed: Regimen is correct and unchanged.   Changes to medications: Gary Campbell reports no changes at this time.  Changes to insurance: No  New side effects reported not previously addressed with a pharmacist or physician: None reported  Questions for the pharmacist: No    Confirmed patient received a Conservation officer, historic buildings and a Surveyor, mining with first shipment. The patient will receive a drug information handout for each medication shipped and additional FDA Medication Guides as required.       DISEASE/MEDICATION-SPECIFIC INFORMATION        N/A    SPECIALTY MEDICATION ADHERENCE     Medication Adherence    Patient reported X missed doses in the last month: 0  Specialty Medication: descovy 200-25mg   Patient is on additional specialty medications: Yes  Additional Specialty Medications: Pifeltro 100mg   Patient Reported Additional Medication X Missed Doses in the Last Month: 0  Patient is on more than two specialty medications: No  Informant: patient                          Were doses missed due to medication being on hold? No    Descovy 200-25 mg: 10 days of medicine on hand   Pifeltro 100 mg: 10 days of medicine on hand       REFERRAL TO PHARMACIST     Referral to the pharmacist: Not needed      [facility]     Shipping address confirmed in Epic.     Delivery Scheduled: Yes, Expected medication delivery date: 03/01/22.     Medication will be delivered via UPS to the prescription address in Epic WAM.    Gary Campbell   [facility] Specialty Technician

## 2022-02-28 MED FILL — PIFELTRO 100 MG TABLET: ORAL | 30 days supply | Qty: 30 | Fill #2

## 2022-02-28 MED FILL — DESCOVY 200 MG-25 MG TABLET: ORAL | 30 days supply | Qty: 30 | Fill #2

## 2022-03-31 NOTE — Unmapped (Signed)
[facility] Refill Coordination Note    Specialty Medication(s) to be Shipped:   Infectious Disease: Descovy and Pifeltro    Other medication(s) to be shipped: No additional medications requested for fill at this time     Gary Campbell, DOB: 10-Oct-1970  Phone: [phone] (home)       All above HIPAA information was verified with patient.     Was a Nurse, learning disability used for this call? No    Completed refill call assessment today to schedule patient's medication shipment from the [facility] [phone]).  All relevant notes have been reviewed.     Specialty medication(s) and dose(s) confirmed: Regimen is correct and unchanged.   Changes to medications: Gary Campbell reports no changes at this time.  Changes to insurance: No  New side effects reported not previously addressed with a pharmacist or physician: None reported  Questions for the pharmacist: No    Confirmed patient received a Conservation officer, historic buildings and a Surveyor, mining with first shipment. The patient will receive a drug information handout for each medication shipped and additional FDA Medication Guides as required.       DISEASE/MEDICATION-SPECIFIC INFORMATION        N/A    SPECIALTY MEDICATION ADHERENCE     Medication Adherence    Patient reported X missed doses in the last month: 0  Specialty Medication: pifeltro 100mg   Patient is on additional specialty medications: Yes  Additional Specialty Medications: descovy 200-25mg   Patient Reported Additional Medication X Missed Doses in the Last Month: 0                          Were doses missed due to medication being on hold? No    pifeltro 100mg   : 6 days of medicine on hand   descovy 200-25mg   : 6 days of medicine on hand       REFERRAL TO PHARMACIST     Referral to the pharmacist: Not needed      [facility]     Shipping address confirmed in Epic.     Delivery Scheduled: Yes, Expected medication delivery date: 10/25.     Medication will be delivered via UPS to the prescription address in Epic WAM.    Gary Campbell   [facility] Specialty Technician

## 2022-04-04 MED FILL — PIFELTRO 100 MG TABLET: ORAL | 30 days supply | Qty: 30 | Fill #3

## 2022-04-04 MED FILL — DESCOVY 200 MG-25 MG TABLET: ORAL | 30 days supply | Qty: 30 | Fill #3

## 2022-04-24 NOTE — Unmapped (Signed)
[facility] Refill Coordination Note    Specialty Medication(s) to be Shipped:   Infectious Disease: Descovy and Pifeltro    Other medication(s) to be shipped: No additional medications requested for fill at this time     Gary Campbell, DOB: 12/19/1970  Phone: [phone] (home)       All above HIPAA information was verified with patient.     Was a Nurse, learning disability used for this call? No    Completed refill call assessment today to schedule patient's medication shipment from the [facility] [phone]).  All relevant notes have been reviewed.     Specialty medication(s) and dose(s) confirmed: Regimen is correct and unchanged.   Changes to medications: Gary Campbell reports no changes at this time.  Changes to insurance: No  New side effects reported not previously addressed with a pharmacist or physician: None reported  Questions for the pharmacist: No    Confirmed patient received a Conservation officer, historic buildings and a Surveyor, mining with first shipment. The patient will receive a drug information handout for each medication shipped and additional FDA Medication Guides as required.       DISEASE/MEDICATION-SPECIFIC INFORMATION        N/A    SPECIALTY MEDICATION ADHERENCE     Medication Adherence    Patient reported X missed doses in the last month: 0  Specialty Medication: DESCOVY 200-25 mg tablet (emtricitabine-tenofovir alafen)  Patient is on additional specialty medications: Yes  Additional Specialty Medications: PIFELTRO 100 mg Tab (doravirine)  Patient Reported Additional Medication X Missed Doses in the Last Month: 0  Patient is on more than two specialty medications: No  Any gaps in refill history greater than 2 weeks in the last 3 months: no  Demonstrates understanding of importance of adherence: yes  Informant: patient  Provider-estimated medication adherence level: good  Patient is at risk for Non-Adherence: No  Reasons for non-adherence: no problems identified Were doses missed due to medication being on hold? No    DESCOVY 200-25 mg tablet (emtricitabine-tenofovir alafen)  : 6 days of medicine on hand   PIFELTRO 100 mg Tab (doravirine)  : 6 days of medicine on hand       REFERRAL TO PHARMACIST     Referral to the pharmacist: Not needed      [facility]     Shipping address confirmed in Epic.     Delivery Scheduled: Yes, Expected medication delivery date: 04/26/22.     Medication will be delivered via UPS to the prescription address in Epic WAM.    Gary Campbell' Gary Campbell Gary Campbell [facility] Specialty Technician

## 2022-04-25 DIAGNOSIS — B20 Human immunodeficiency virus [HIV] disease: Principal | ICD-10-CM

## 2022-04-25 MED FILL — DESCOVY 200 MG-25 MG TABLET: ORAL | 30 days supply | Qty: 30 | Fill #4

## 2022-04-25 MED FILL — PIFELTRO 100 MG TABLET: ORAL | 30 days supply | Qty: 30 | Fill #4

## 2022-05-17 NOTE — Unmapped (Signed)
[facility] Refill Coordination Note    Specialty Medication(s) to be Shipped:   Infectious Disease: Descovy and Pifeltro    Other medication(s) to be shipped: No additional medications requested for fill at this time     [name], DOB: 1971/06/10  Phone: [phone] (home)       All above HIPAA information was verified with patient.     Was a Nurse, learning disability used for this call? No    Completed refill call assessment today to schedule patient's medication shipment from the [facility] [phone]).  All relevant notes have been reviewed.     Specialty medication(s) and dose(s) confirmed: Regimen is correct and unchanged.   Changes to medications: [name] reports no changes at this time.  Changes to insurance: No  New side effects reported not previously addressed with a pharmacist or physician: None reported  Questions for the pharmacist: No    Confirmed patient received a Conservation officer, historic buildings and a Surveyor, mining with first shipment. The patient will receive a drug information handout for each medication shipped and additional FDA Medication Guides as required.       DISEASE/MEDICATION-SPECIFIC INFORMATION        N/A    SPECIALTY MEDICATION ADHERENCE     Medication Adherence    Patient reported X missed doses in the last month: 0  Specialty Medication: descovy 200-25mg   Patient is on additional specialty medications: Yes  Additional Specialty Medications: pifeltro 100mg   Patient Reported Additional Medication X Missed Doses in the Last Month: 0  Patient is on more than two specialty medications: No                                Were doses missed due to medication being on hold? No    Descovy 200-25 mg: 7 days of medicine on hand   Pileltro 100 mg: 7 days of medicine on hand       REFERRAL TO PHARMACIST     Referral to the pharmacist: Not needed      [facility]     Shipping address confirmed in Epic.     Delivery Scheduled: Yes, Expected medication delivery date: 05/19/22. Medication will be delivered via UPS to the prescription address in Epic WAM.    Nancy Nordmann [facility] Specialty Technician

## 2022-05-19 MED FILL — DESCOVY 200 MG-25 MG TABLET: ORAL | 30 days supply | Qty: 30 | Fill #5

## 2022-05-19 MED FILL — PIFELTRO 100 MG TABLET: ORAL | 30 days supply | Qty: 30 | Fill #5

## 2022-06-08 MED ORDER — DESCOVY 200 MG-25 MG TABLET
ORAL_TABLET | Freq: Every day | ORAL | 5 refills | 30 days
Start: 2022-06-08 — End: ?

## 2022-06-08 MED ORDER — PIFELTRO 100 MG TABLET
ORAL_TABLET | Freq: Every day | ORAL | 5 refills | 30 days
Start: 2022-06-08 — End: ?

## 2022-06-08 NOTE — Unmapped (Signed)
[facility] Refill Coordination Note    Specialty Medication(s) to be Shipped:   Infectious Disease: Descovy and Pifeltro    Other medication(s) to be shipped: No additional medications requested for fill at this time     Gary Campbell, DOB: 1971-01-10  Phone: [phone] (home)       All above HIPAA information was verified with patient.     Was a Nurse, learning disability used for this call? No    Completed refill call assessment today to schedule patient's medication shipment from the [facility] [phone]).  All relevant notes have been reviewed.     Specialty medication(s) and dose(s) confirmed: Regimen is correct and unchanged.   Changes to medications: Gary Campbell reports no changes at this time.  Changes to insurance: No  New side effects reported not previously addressed with a pharmacist or physician: None reported  Questions for the pharmacist: No    Confirmed patient received a Conservation officer, historic buildings and a Surveyor, mining with first shipment. The patient will receive a drug information handout for each medication shipped and additional FDA Medication Guides as required.       DISEASE/MEDICATION-SPECIFIC INFORMATION        N/A    SPECIALTY MEDICATION ADHERENCE     Medication Adherence    Patient reported X missed doses in the last month: 0  Specialty Medication: pifeltro 100mg   Patient is on additional specialty medications: Yes  Additional Specialty Medications: descovy 200-25mg   Patient Reported Additional Medication X Missed Doses in the Last Month: 0  Patient is on more than two specialty medications: No                                Were doses missed due to medication being on hold? No    Descovy 200-25 mg: 9 days of medicine on hand   Pileltro 100 mg: 9 days of medicine on hand       REFERRAL TO PHARMACIST     Referral to the pharmacist: Not needed      [facility]     Shipping address confirmed in Epic.     Delivery Scheduled: Yes, Expected medication delivery date: 06/14/21. Medication will be delivered via UPS to the prescription address in Epic WAM.    [place] A Tyronda Vizcarrondo   [facility] Specialty Technician

## 2022-06-09 MED ORDER — PIFELTRO 100 MG TABLET
ORAL_TABLET | Freq: Every day | ORAL | 5 refills | 30 days
Start: 2022-06-09 — End: ?

## 2022-06-09 MED ORDER — DESCOVY 200 MG-25 MG TABLET
ORAL_TABLET | Freq: Every day | ORAL | 5 refills | 30 days
Start: 2022-06-09 — End: ?

## 2022-06-13 NOTE — Unmapped (Signed)
[name] 's descovy and pifeltro shipment will be delayed as a result of the medication is too soon to refill until 1/6.     I have reached out to the patient  at [phone] and communicated the delivery change. We will reschedule the medication for the delivery date that the patient agreed upon.  We have confirmed the delivery date as 1/9, via ups.

## 2022-06-19 DIAGNOSIS — B20 Human immunodeficiency virus [HIV] disease: Principal | ICD-10-CM

## 2022-06-19 MED FILL — DESCOVY 200 MG-25 MG TABLET: ORAL | 30 days supply | Qty: 30 | Fill #0

## 2022-06-19 MED FILL — PIFELTRO 100 MG TABLET: ORAL | 30 days supply | Qty: 30 | Fill #0

## 2022-07-26 MED FILL — PIFELTRO 100 MG TABLET: ORAL | 30 days supply | Qty: 30 | Fill #1

## 2022-07-26 MED FILL — DESCOVY 200 MG-25 MG TABLET: ORAL | 30 days supply | Qty: 30 | Fill #1

## 2022-07-26 NOTE — Unmapped (Unsigned)
[facility] Refill Coordination Note    Specialty Medication(s) to be Shipped:   Infectious Disease: Descovy and Pifeltro    Other medication(s) to be shipped: No additional medications requested for fill at this time     Gary Campbell, DOB: 1970/12/26  Phone: [phone] (home)       All above HIPAA information was verified with patient.     Was a Nurse, learning disability used for this call? No    Completed refill call assessment today to schedule patient's medication shipment from the [facility] [phone]).  All relevant notes have been reviewed.     Specialty medication(s) and dose(s) confirmed: Regimen is correct and unchanged.   Changes to medications: Gary Campbell reports no changes at this time.  Changes to insurance: No  New side effects reported not previously addressed with a pharmacist or physician: None reported  Questions for the pharmacist: No    Confirmed patient received a Conservation officer, historic buildings and a Surveyor, mining with first shipment. The patient will receive a drug information handout for each medication shipped and additional FDA Medication Guides as required.       DISEASE/MEDICATION-SPECIFIC INFORMATION        N/A    SPECIALTY MEDICATION ADHERENCE     Medication Adherence    Patient reported X missed doses in the last month: 0  Specialty Medication: descovy 200-25mg   Patient is on additional specialty medications: Yes  Additional Specialty Medications: pifeltro 100mg   Patient Reported Additional Medication X Missed Doses in the Last Month: 0  Patient is on more than two specialty medications: No  Informant: patient  Reliability of informant: reliable  Confirmed plan for next specialty medication refill: delivery by pharmacy  Refills needed for supportive medications: not needed          Refill Coordination    Has the Patients' Contact Information Changed: No  Is the Shipping Address Different: No         Were doses missed due to medication being on hold? No    DESCOVY 200-25 mg tablet (emtricitabine-tenofovir alafen) : 7 days of medicine on hand   PIFELTRO 100 mg Tab (doravirine) : 7 days of medicine on hand       REFERRAL TO PHARMACIST     Referral to the pharmacist: Not needed      [facility]     Shipping address confirmed in Epic.     Patient was notified of new phone menu : Yes: discussed phone prompts changing.    Delivery Scheduled: Yes, Expected medication delivery date: 07/28/2022.     Medication will be delivered via UPS to the prescription address in Epic WAM.    Gary Campbell   [facility] Specialty Technician

## 2022-08-16 MED FILL — PIFELTRO 100 MG TABLET: ORAL | 30 days supply | Qty: 30 | Fill #2

## 2022-08-16 MED FILL — DESCOVY 200 MG-25 MG TABLET: ORAL | 30 days supply | Qty: 30 | Fill #2

## 2022-08-16 NOTE — Unmapped (Signed)
[facility] Clinical Assessment & Refill Coordination Note    Gary Campbell, DOB: Jun 19, 1970  Phone: [phone] (home)     All above HIPAA information was verified with patient.     Was a Nurse, learning disability used for this call? No    Specialty Medication(s):   Infectious Disease: Descovy and Pifeltro     Current Outpatient Medications   Medication Sig Dispense Refill    atorvastatin (LIPITOR) 20 MG tablet Take 1 tablet (20 mg total) by mouth daily. 30 tablet 11    doravirine (PIFELTRO) 100 mg Tab Take 1 tablet (100 mg total) by mouth daily. 30 tablet 5    doravirine (PIFELTRO) 100 mg Tab Take 1 tablet (100 mg total) by mouth daily. 30 tablet 5    emtricitabine-tenofovir alafen (DESCOVY) 200-25 mg tablet Take 1 tablet by mouth daily. 30 tablet 6    emtricitabine-tenofovir alafen (DESCOVY) 200-25 mg tablet Take 1 tablet by mouth daily. 30 tablet 5    hydroCHLOROthiazide (HYDRODIURIL) 25 MG tablet TAKE 1 TABLET BY MOUTH ONCE DAILY 90 tablet 0    ibuprofen (ADVIL,MOTRIN) 200 MG tablet Take 400 mg by mouth every six (6) hours as needed for pain.      losartan (COZAAR) 50 MG tablet Take 50 mg by mouth daily.      sildenafiL (VIAGRA) 100 MG tablet Take 1 tablet (100 mg total) by mouth once as needed for erectile dysfunction for up to 1 dose. 1 tablet 6     No current facility-administered medications for this visit.        Changes to medications: Gary Campbell reports no changes at this time.    Allergies   Allergen Reactions    Ace Inhibitors Cough       Changes to allergies: No    SPECIALTY MEDICATION ADHERENCE     Descovy 200-25 mg: approximately 22 days of medicine on hand. Filling early: Leaving for [place] on 08/21/22  Pifeltro 100 mg: approximately 22 days of medicine on hand. Filling early: Leaving for [place] on 08/21/22       Medication Adherence    Patient reported X missed doses in the last month: 0  Specialty Medication: Descovy 200-25mg   Patient is on additional specialty medications: Yes  Additional Specialty Medications: Pifeltro 100mg   Patient Reported Additional Medication X Missed Doses in the Last Month: 0  Patient is on more than two specialty medications: No  Any gaps in refill history greater than 2 weeks in the last 3 months: no  Demonstrates understanding of importance of adherence: yes  Informant: patient  Provider-estimated medication adherence level: good  Patient is at risk for Non-Adherence: No          Specialty medication(s) dose(s) confirmed: Regimen is correct and unchanged.     Are there any concerns with adherence? No    Adherence counseling provided? Not needed    CLINICAL MANAGEMENT AND INTERVENTION      Clinical Benefit Assessment:    Do you feel the medicine is effective or helping your condition? Yes    Clinical Benefit counseling provided? Not needed    Adverse Effects Assessment:    Are you experiencing any side effects? No    Are you experiencing difficulty administering your medicine? No    Quality of Life Assessment:    Quality of Life    Rheumatology  Oncology  Dermatology  Cystic Fibrosis          How many days over the past month  did your HIV  keep you from your normal activities? For example, brushing your teeth or getting up in the morning. 0    Have you discussed this with your provider? Not needed    Acute Infection Status:    Acute infections noted within Epic:  No active infections  Patient reported infection: None    Therapy Appropriateness:    Is therapy appropriate and patient progressing towards therapeutic goals? Yes, therapy is appropriate and should be continued    DISEASE/MEDICATION-SPECIFIC INFORMATION      N/A    HIV: Not Applicable    PATIENT SPECIFIC NEEDS     Does the patient have any physical, cognitive, or cultural barriers? No    Is the patient high risk? No    Did the patient require a clinical intervention? No    Does the patient require physician intervention or other additional services (i.e., nutrition, smoking cessation, social work)? No    SOCIAL DETERMINANTS OF HEALTH     At the [facility], we have learned that life circumstances - like trouble affording food, housing, utilities, or transportation can affect the health of many of our patients.   That is why we wanted to ask: are you currently experiencing any life circumstances that are negatively impacting your health and/or quality of life? Patient declined to answer    Social Determinants of Health     Financial Resource Strain: Not on file   Internet Connectivity: Not on file   Food Insecurity: Not on file   Tobacco Use: Low Risk  (04/05/2020)    Patient History     Smoking Tobacco Use: Never     Smokeless Tobacco Use: Never     Passive Exposure: Not on file   Housing/Utilities: Not on file   Alcohol Use: Not on file   Transportation Needs: Not on file   Substance Use: Not on file   Health Literacy: Not on file   Physical Activity: Not on file   Interpersonal Safety: Not on file   Stress: Not on file   Intimate Partner Violence: Not on file   Depression: Not on file   Social Connections: Not on file       Would you be willing to receive help with any of the needs that you have identified today? Not applicable       SHIPPING     Specialty Medication(s) to be Shipped:   Infectious Disease: Descovy and Pifeltro    Other medication(s) to be shipped: No additional medications requested for fill at this time     Changes to insurance: No    Patient was informed of new phone menu: Yes    Delivery Scheduled: Yes, Expected medication delivery date: 08/17/22.     Medication will be delivered via UPS to the confirmed prescription address in [facility].    The patient will receive a drug information handout for each medication shipped and additional FDA Medication Guides as required.  Verified that patient has previously received a Conservation officer, historic buildings and a Surveyor, mining.    The patient or caregiver noted above participated in the development of this care plan and knows that they can request review of or adjustments to the care plan at any time.      All of the patient's questions and concerns have been addressed.    Roderic Palau, PharmD   [facility] Specialty Pharmacist

## 2022-10-05 DIAGNOSIS — B20 Human immunodeficiency virus [HIV] disease: Principal | ICD-10-CM

## 2022-10-05 NOTE — Unmapped (Signed)
[facility] Refill Coordination Note    Specialty Medication(s) to be Shipped:   Infectious Disease: Descovy and Pifeltro    Other medication(s) to be shipped: No additional medications requested for fill at this time     Gary Campbell, DOB: 1970-11-19  Phone: [phone] (home)       All above HIPAA information was verified with patient.     Was a Nurse, learning disability used for this call? No    Completed refill call assessment today to schedule patient's medication shipment from the [facility] [phone]).  All relevant notes have been reviewed.     Specialty medication(s) and dose(s) confirmed: Regimen is correct and unchanged.   Changes to medications: Gary Campbell reports no changes at this time.  Changes to insurance: Yes: Insurance has been updated and marked to Primary  New side effects reported not previously addressed with a pharmacist or physician: None reported  Questions for the pharmacist: No    Confirmed patient received a Conservation officer, historic buildings and a Surveyor, mining with first shipment. The patient will receive a drug information handout for each medication shipped and additional FDA Medication Guides as required.       DISEASE/MEDICATION-SPECIFIC INFORMATION        N/A    SPECIALTY MEDICATION ADHERENCE     Medication Adherence    Specialty Medication: DESCOVY 200-25 mg tablet (emtricitabine-tenofovir alafen)  Patient is on additional specialty medications: Yes  Additional Specialty Medications: PIFELTRO 100 mg Tab (doravirine)                Were doses missed due to medication being on hold? No    DESCOVY 200-25 mg tablet (emtricitabine-tenofovir alafen) : 4-5 days of medicine on hand   PIFELTRO 100 mg Tab (doravirine) : 4-5 days of medicine on hand       REFERRAL TO PHARMACIST     Referral to the pharmacist: Not needed      [facility]     Shipping address confirmed in Epic.     Patient was notified of new phone menu : Yes: discussed phone prompts changing.    Delivery Scheduled: Yes, Expected medication delivery date: 10/10/2022. New insurance requires PA for Pifeltro; Patient is aware that it may be delayed pending approval.    Medication will be delivered via UPS to the prescription address in Epic [facility].    Alwyn Pea   [facility] Specialty Technician

## 2022-10-09 NOTE — Unmapped (Signed)
Gary Campbell 's entire shipment will be canceled  as a result of Pifeltro PA denied.     I have reached out to the patient  at [phone] and communicated the delay. We will not reschedule the medication and have removed this/these medication(s) from the work request.  We have canceled this work request.     Pt is aware and is reaching out to Provider for next steps / possible alternative.

## 2023-01-03 NOTE — Unmapped (Signed)
Specialty Medication(s): Descovy 200-25mg  and Pifeltro 100mg     Gary Campbell has been dis-enrolled from the [facility] specialty pharmacy services due to a change in therapy. The patient is now taking Dovato and is not filling at the [facility].    Gary Campbell is receiving Dovato from [facility]    Additional information provided to the patient: I told him he is always welcome to call and if he would like to fill his medication with the [facility] to please give [place] a call.    Roderic Palau, PharmD  [facility] Specialty Pharmacist

## 2023-01-19 MED ORDER — DOVATO 50 MG-300 MG TABLET
ORAL_TABLET | Freq: Every day | ORAL | 3 refills | 0 days
Start: 2023-01-19 — End: ?

## 2023-01-19 MED ORDER — TRIAMCINOLONE ACETONIDE 0.1 % TOPICAL CREAM
TOPICAL | 1 refills | 0 days
Start: 2023-01-19 — End: ?

## 2023-09-24 DIAGNOSIS — B182 Chronic viral hepatitis C: Principal | ICD-10-CM

## 2023-09-24 MED ORDER — SOFOSBUVIR 400 MG-VELPATASVIR 100 MG TABLET
ORAL_TABLET | 0 refills | 0.00 days
Start: 2023-09-24 — End: ?

## 2023-10-16 NOTE — Unmapped (Signed)
 [facility] Specialty Medication Onboarding    Specialty Medication: sofosbuvir-velpatasvir 400-100 mg tablet (EPCLUSA)  Prior Authorization: Approved   Financial Assistance: No - copay  <$25  Final Copay/Day Supply: $15 / 28    Insurance Restrictions: Yes - max 1 month supply     Notes to Pharmacist:   Credit Card on File: no  Start Date on Rx:      The triage team has completed the benefits investigation and has determined that the patient is able to fill this medication at [facility]. Please contact the patient to complete the onboarding or follow up with the prescribing physician as needed.

## 2023-10-18 NOTE — Unmapped (Signed)
 [facility]    Patient Onboarding/Medication Counseling    Gary Campbell is a 53 y.o. male with Hep C/HIV who I am counseling today on initiation of therapy.  I am speaking to the patient.    Was a Nurse, learning disability used for this call? No    Verified patient's date of birth / HIPAA.    Specialty medication(s) to be sent: Infectious Disease: sofosbuvir/velpatasvir      Non-specialty medications/supplies to be sent: n/a      Medications not needed at this time: n/a         Epclusa (sofosbuvir and velpatasvir) 400mg /100mg     Planned Start Date: 10/23/23    Has the patient been told to call and make a 4 week follow-up appointment after their medicine has been received? N/A - non [facility] provider  Patients of Dr. Mason Sole, Dr. Rolande Cleverly, Watson Hacking, Beatrix Lindau, call [facility] appointment schedulers: [phone]  Patients of any other provider at the [facility] liver clinic: Call [phone], option #1, option #1  Patients of [facility]: call Adriana Hopping Jerlene Moody) Randal Bury at [phone]      Medication & Administration     Dosage: Take one tablet by mouth daily for 12 weeks.    Administration:  Take with or without food.  Antacids: Separate the administration of Epclusa and antacids by at least 4 hours.  H2 Receptor Antagonist: Administer H2 receptor antagonist doses less than or comparable to famotidine 40 mg twice daily simultaneously or 12 hours apart from [place].   Proton Pump Inhibitor (PPI): Epclusa should be administered with food and taken 4 hours before omeprazole max dose of 20mg .      Adherence/Missed dose instructions:  Take missed dose as soon as you remember. If it is close to the time of your next dose, skip the missed dose and resume with your next scheduled dose.  Do not take extra doses or 2 doses at the same time.  Avoid missing doses as it can result in development of resistance.    Goals of Therapy     The goal of therapy is to cure the patient of Hepatitis C. Patients who have undetectable HCV RNA in the serum, as assessed by a sensitive polymerase chain reaction (PCR) assay, >=12 weeks after treatment completion are deemed to have achieved SVR (cure). (www.hcvguidelines.org)    Side Effects & Monitoring Parameters     Common Side Effects:   Headache  Fatigue     To help minimize some of the side effects: (http://www.velazquez.com/)  Stay hydrated by drinking plenty of water and limiting caffeinated beverages such as coffee, tea and soda.  Do your hardest chores when you have the most energy.  Take short naps of 20 minutes or less that are and not too close to bedtime.    The following side effects should be reported to the provider:  Signs of liver problems like dark urine, feeling excessively tired, lack of hunger, upset stomach or stomach pain, light-colored stools, throwing up, or yellow skin/eyes.  Signs of an allergic reaction, such as rash; hives; itching; red, swollen, blistered, or peeling skin with or without fever. If you have wheezing; tightness in the chest or throat; trouble breathing, swallowing, or talking; unusual hoarseness; or swelling of the mouth, face, lips, tongue, or throat, call 911 or go to the closest emergency department (ED).     Monitoring Parameters: (AASLD-IDSA Guidelines)    Within 6 months prior to starting treatment:  Complete blood  count (CBC).  International normalized ratio (INR) if clinically indicated.   Hepatic function panel: albumin, total and direct bilirubin, ALT, AST, and Alkaline Phosphatase levels.  Calculated glomerular filtration rate (eGFR).  Pregnancy test within 1 month of starting treatment for females of childbearing age  Any time prior to starting treatment:  Test for HCV genotype.  Assess for hepatic fibrosis.  Quantitative HCV RNA (HCV viral load).  Assess for active HBV coinfection: HBV surface antigen (HBsAg); If HBsAg positive, then should be assessed for whether HBV DNA level meets AASLD criteria for HBV treatment.  Assess for evidence of prior HBV infection and immunity: HBV core antibody (anti-HBc) and HBV surface antibody (anti-HBS).   Assess for HIV coinfection.  Test for the presence of resistance-associated substitutions (RASs) prior to starting treatment if clinically indicated.    Monitoring during treatment:   Diabetics should monitor for signs of hypoglycemia.  Patients on warfarin should monitor for changes in INR.  Pregnancy test monthly in females of childbearing age  For HBsAg positive patients not already on HBV therapy because baseline HBV DNA level does not meet treatment criteria:   Initiate prophylactic HBV antiviral therapy OR  Monitor HBV DNA levels monthly during and immediately after Epclusa therapy.    After treatment to document a cure:   Quantitative HCV RNA12 weeks after completion of therapy.    Contraindications, Warnings, & Precautions     Hepatitis B reactivation.  Bradycardia with amiodarone coadministration: Coadministration not recommended. If unavoidable, patients should have inpatient cardiac monitoring for 48 hours after first Epclusa dose, followed by daily outpatient or self-monitoring of heart rate for at least the first 2 weeks of treatment. Due to the long half-life of amiodarone, cardiac monitoring is also recommended if amiodarone was discontinued just prior to beginning treatment.     Drug/Food Interactions     Medication list reviewed in Epic. The patient was instructed to inform the care team before taking any new medications or supplements. No drug interactions identified.  Encourage minimizing alcohol intake.  Antacids: Separate the administration of Epclusa and antacids by at least 4 hours.  H2 Receptor Antagonists: Administer H2 receptor antagonist doses less than or comparable to famotidine 40 mg twice daily simultaneously or 12 hours apart from [place].  Proton pump inhibitors: Not recommended. If medically necessary, Epclusa should be administered with food and taken 4 hours before omeprazole at a maximum dose of 20mg . Other proton pump inhibitors have not been studied.  HMG-CoA reductase inhibitors: monitor for signs of statin toxicity such as myopathy including rhabdomyolysis. Dose reduction of statin may be necessary. Ask patients to self-report potential side effects of increased concentrations such as muscle pain.  Rosuvastatin dose should not exceed 10mg .  Atorvastatin : Consider dose reduction if patient's dose is greater than 40mg .  Pravastatin: no clinically significant interaction.  Potential interactions: Clearance of HCV infection with direct acting antivirals may lead to changes in hepatic function, which may impact the safe and effective use of concomitant medications. Frequent monitoring of relevant laboratory parameters (e.g., International Normalized Ratio [INR] in patients taking warfarin, blood glucose levels in diabetic patients) or drug concentrations of concomitant medications such as cytochrome P450 substrates with a narrow therapeutic index (e.g. certain immunosuppressants) is recommended to ensure safe and effective use. Dose adjustments of concomitant medications may be necessary.    Storage, Handling Precautions, & Disposal     Store this medication in the original container at room temperature.  Store in a dry place. Do not  store in a bathroom.  Keep all drugs in a safe place. Keep all drugs out of the reach of children and pets.  Disposal: You should NOT have any Epclusa left over to throw away      Current Medications (including OTC/herbals), Comorbidities and Allergies     Current Outpatient Medications   Medication Sig Dispense Refill    dolutegravir-lamiVUDine (DOVATO) 50-300 mg Tab Take 1 tablet by mouth daily. 90 tablet 3    hydroCHLOROthiazide (HYDRODIURIL) 25 MG tablet TAKE 1 TABLET BY MOUTH ONCE DAILY 90 tablet 0    ibuprofen (ADVIL,MOTRIN) 200 MG tablet Take 400 mg by mouth every six (6) hours as needed for pain.      sildenafiL (VIAGRA) 100 MG tablet Take 1 tablet (100 mg total) by mouth once as needed for erectile dysfunction for up to 1 dose. 1 tablet 6    sofosbuvir-velpatasvir (EPCLUSA) 400-100 mg tablet Take 1 tablet every day by oral route as directed for 84 days. 84 tablet 0    triamcinolone (KENALOG) 0.1 % cream Apply a thin layer to the affected area(s) twice daily. 15 g 1     No current facility-administered medications for this visit.       Allergies   Allergen Reactions    Ace Inhibitors Cough       Patient Active Problem List   Diagnosis    HIV disease    HBV (hepatitis B virus) infection       Medication list has been reviewed and updated in Epic: Yes    Allergies have been reviewed and updated in Epic: Yes    Appropriateness of Therapy     Acute infections noted within Epic:  No active infections  Patient reported infection: None    Is the medication and dose appropriate based on diagnosis, medication list, comorbidities, allergies, medical history, patient???s ability to self-administer the medication, and therapeutic goals? Yes    Prescription has been clinically reviewed: Yes      Baseline Quality of Life Assessment      How many days over the past month did your Hep C/HIV  keep you from your normal activities? For example, brushing your teeth or getting up in the morning. Patient declined to answer    Financial Information     Medication Assistance provided: Prior Authorization and Copay Assistance    Anticipated copay of $5 reviewed with patient. Verified delivery address.    Delivery Information     Scheduled delivery date: 10/22/23    Expected start date: 10/22/23      Medication will be delivered via UPS to the prescription address in [facility].  This shipment will not require a signature.      Explained the services we provide at [facility] and that each month we would call to set up refills.  Stressed importance of returning phone calls so that we could ensure they receive their medications in time each month. Informed patient that we should be setting up refills 7-10 days prior to when they will run out of medication.  A pharmacist will reach out to perform a clinical assessment periodically.  Informed patient that a welcome packet, containing information about our pharmacy and other support services, a Notice of Privacy Practices, and a drug information handout will be sent.      The patient or caregiver noted above participated in the development of this care plan and knows that they can request review of or adjustments to  the care plan at any time.      Patient or caregiver verbalized understanding of the above information as well as how to contact the pharmacy at [phone] option 4 with any questions/concerns.  The pharmacy is open Monday through Friday 8:30am-4:30pm.  A pharmacist is available 24/7 via pager to answer any clinical questions they may have.    Patient Specific Needs     Does the patient have any physical, cognitive, or cultural barriers? No    Does the patient have adequate living arrangements? (i.e. the ability to store and take their medication appropriately) Yes    Did you identify any home environmental safety or security hazards? No    Patient prefers to have medications discussed with  Patient     Is the patient or caregiver able to read and understand education materials at a high school level or above? Yes    Patient's primary language is  English     Is the patient high risk? No    Does the patient have an additional or emergency contact listed in their chart? Yes    SOCIAL DETERMINANTS OF HEALTH     At the [facility], we have learned that life circumstances - like trouble affording food, housing, utilities, or transportation can affect the health of many of our patients.   That is why we wanted to ask: are you currently experiencing any life circumstances that are negatively impacting your health and/or quality of life? Patient declined to answer    Social Drivers of Health     Food Insecurity: Not on file   Tobacco Use: Low Risk  (04/19/2022)    Received from [facility]    Patient History     Smoking Tobacco Use: Never     Smokeless Tobacco Use: Never     Passive Exposure: Not on file   Transportation Needs: Not on file   Alcohol Use: Unknown (03/22/2022)    Received from [facility], [facility] visits prior to 08/12/2022.    AUDIT-C     Frequency of Alcohol Consumption: Monthly or less     Average Number of Drinks: Not on file     Frequency of Binge Drinking: Not on file   Housing: Not on file   Physical Activity: Not on file   Utilities: Not on file   Stress: Not on file   Interpersonal Safety: Not on file   Substance Use: Not on file (04/21/2023)   Intimate Partner Violence: Unknown (09/13/2021)    Received from [facility]    HITS     Physically Hurt: Not on file     Insult or Talk Down To: Not on file     Threaten Physical Harm: Not on file     Scream or Curse: Not on file   Social Connections: Unknown (10/21/2021)    Received from [facility]    Social Network     Social Network: Not on file   Financial Resource Strain: Not on file   Health Literacy: Not on file   Internet Connectivity: Not on file       Would you be willing to receive help with any of the needs that you have identified today? Not applicable       Riesa Chary, PharmD  [facility] Specialty Pharmacist

## 2023-10-19 MED FILL — SOFOSBUVIR 400 MG-VELPATASVIR 100 MG TABLET: ORAL | 28 days supply | Qty: 28 | Fill #0

## 2023-11-12 NOTE — Unmapped (Signed)
 [facility] Clinical Assessment & Refill Coordination Note    Fill 2 of 3 of Epclusa  Reports missing 1 dose since starting 5/12    Gary Campbell, DOB: December 20, 1970  Phone: [phone] (home)     All above HIPAA information was verified with patient.     Was a Nurse, learning disability used for this call? No    Specialty Medication(s):   Infectious Disease: sofosbuvir/velpatasvir     Current Outpatient Medications   Medication Sig Dispense Refill    dolutegravir-lamiVUDine (DOVATO) 50-300 mg Tab Take 1 tablet by mouth daily. 90 tablet 3    hydroCHLOROthiazide (HYDRODIURIL) 25 MG tablet TAKE 1 TABLET BY MOUTH ONCE DAILY 90 tablet 0    ibuprofen (ADVIL,MOTRIN) 200 MG tablet Take 400 mg by mouth every six (6) hours as needed for pain.      sildenafiL (VIAGRA) 100 MG tablet Take 1 tablet (100 mg total) by mouth once as needed for erectile dysfunction for up to 1 dose. 1 tablet 6    sofosbuvir-velpatasvir (EPCLUSA) 400-100 mg tablet Take 1 tablet every day by oral route as directed 84 tablet 0    triamcinolone (KENALOG) 0.1 % cream Apply a thin layer to the affected area(s) twice daily. 15 g 1     No current facility-administered medications for this visit.        Changes to medications: Ziggy reports no changes at this time.    Medication list has been reviewed and updated in Epic: Yes    Allergies   Allergen Reactions    Ace Inhibitors Cough       Changes to allergies: No    Allergies have been reviewed and updated in Epic: Yes    SPECIALTY MEDICATION ADHERENCE     Sofosbuvir-velpatasvir 400-100 mg: ~8 days of medicine on hand (unsure of exact amount but states he started 5/12 and has missed 1 dose)      Medication Adherence    Patient reported X missed doses in the last month: 1  Specialty Medication: sofosbuvir-velpatasvir 400-100mg  QD  Patient is on additional specialty medications: No  Informant: patient          Specialty medication(s) dose(s) confirmed: Regimen is correct and unchanged.     Are there any concerns with adherence? No    Adherence counseling provided? Not needed    CLINICAL MANAGEMENT AND INTERVENTION      Clinical Benefit Assessment:    Do you feel the medicine is effective or helping your condition? Patient declined to answer    Clinical Benefit counseling provided? Not needed    Adverse Effects Assessment:    Are you experiencing any side effects? No    Are you experiencing difficulty administering your medicine? No    Quality of Life Assessment:    Quality of Life    Rheumatology  Oncology  Dermatology  Cystic Fibrosis          How many days over the past month did your HCV  keep you from your normal activities? For example, brushing your teeth or getting up in the morning. Patient declined to answer    Have you discussed this with your provider? Not needed    Acute Infection Status:    Acute infections noted within Epic:  No active infections    Patient reported infection: None    Therapy Appropriateness:    Is therapy appropriate based on current medication list, adverse reactions, adherence, clinical benefit and progress toward achieving therapeutic goals?  Yes, therapy is appropriate and should be continued     Clinical Intervention:    Was an intervention completed as part of this clinical assessment? No    DISEASE/MEDICATION-SPECIFIC INFORMATION      For Hepatitis C patients (clinical assessment):  Regimen: Epclusa x 12 weeks  Therapy start date: 10/22/23  Completed Treatment Week #: 3  What time of day do you take your medicine? Morning  Pill count over the phone revealed #unsure of exact amount of tablets which is n/a.    Are you taking any new OTC or herbal medication? No  Any alcoholic beverages? Patient declined to answer  Do you have a follow up appointment? Yes, appointment is scheduled, patient is aware, and no identified barriers    Hepatitis C: Not Applicable    PATIENT SPECIFIC NEEDS     Does the patient have any physical, cognitive, or cultural barriers? No    Is the patient high risk? No    Does the patient require physician intervention or other additional services (i.e., nutrition, smoking cessation, social work)? No    Does the patient have an additional or emergency contact listed in their chart? Yes    SOCIAL DETERMINANTS OF HEALTH     At the [facility], we have learned that life circumstances - like trouble affording food, housing, utilities, or transportation can affect the health of many of our patients.   That is why we wanted to ask: are you currently experiencing any life circumstances that are negatively impacting your health and/or quality of life? Patient declined to answer    Social Drivers of Health     Food Insecurity: Not on file   Tobacco Use: Low Risk  (04/19/2022)    Received from [facility]    Patient History     Smoking Tobacco Use: Never     Smokeless Tobacco Use: Never     Passive Exposure: Not on file   Transportation Needs: Not on file   Alcohol Use: Unknown (03/22/2022)    Received from [facility], [facility] visits prior to 08/12/2022.    AUDIT-C     Frequency of Alcohol Consumption: Monthly or less     Average Number of Drinks: Not on file     Frequency of Binge Drinking: Not on file   Housing: Not on file   Physical Activity: Not on file   Utilities: Not on file   Stress: Not on file   Interpersonal Safety: Not on file   Substance Use: Not on file (04/21/2023)   Intimate Partner Violence: Unknown (09/13/2021)    Received from [facility]    HITS     Physically Hurt: Not on file     Insult or Talk Down To: Not on file     Threaten Physical Harm: Not on file     Scream or Curse: Not on file   Social Connections: Unknown (10/21/2021)    Received from [facility]    Social Network     Social Network: Not on file   Financial Resource Strain: Not on file   Health Literacy: Not on file   Internet Connectivity: Not on file       Would you be willing to receive help with any of the needs that you have identified today? Not applicable       SHIPPING     Specialty Medication(s) to be Shipped:   Infectious Disease: sofosbuvir/velpatasvir    Other medication(s) to be  shipped: No additional medications requested for fill at this time     Changes to insurance: No    Cost and Payment: Patient has a copay of $5. They are aware and have authorized the pharmacy to charge the credit card on file.    Delivery Scheduled: Yes, Expected medication delivery date: 11/14/23.     Medication will be delivered via UPS to the confirmed prescription address in [facility].    The patient will receive a drug information handout for each medication shipped and additional FDA Medication Guides as required.  Verified that patient has previously received a Conservation officer, historic buildings and a Surveyor, mining.    The patient or caregiver noted above participated in the development of this care plan and knows that they can request review of or adjustments to the care plan at any time.      All of the patient's questions and concerns have been addressed.    Riesa Chary, PharmD   [facility] Specialty Pharmacist

## 2023-11-13 MED FILL — SOFOSBUVIR 400 MG-VELPATASVIR 100 MG TABLET: ORAL | 28 days supply | Qty: 28 | Fill #1

## 2023-12-05 NOTE — Unmapped (Signed)
 [facility] Refill Coordination Note    Fill 3 of 3 of Epclusa   Est EOT: 01/14/24    Specialty Medication(s) to be Shipped:   Infectious Disease: sofosbuvir /velpatasvir     Other medication(s) to be shipped: No additional medications requested for fill at this time     Gary Campbell, DOB: 06/21/1970  Phone: [phone] (home)       All above HIPAA information was verified with patient.     Was a Nurse, learning disability used for this call? No    Completed refill call assessment today to schedule patient's medication shipment from the [facility]  [phone]).  All relevant notes have been reviewed.     Specialty medication(s) and dose(s) confirmed: Regimen is correct and unchanged.   Changes to medications: Lynford reports no changes at this time.  Changes to insurance: No  New side effects reported not previously addressed with a pharmacist or physician: None reported  Questions for the pharmacist: No    Confirmed patient received a Conservation officer, historic buildings and a Surveyor, mining with first shipment. The patient will receive a drug information handout for each medication shipped and additional FDA Medication Guides as required.       DISEASE/MEDICATION-SPECIFIC INFORMATION        For Hepatitis C patients (refill assessment): Has anything changed regarding the time you take your medications? No    SPECIALTY MEDICATION ADHERENCE     Medication Adherence    Patient reported X missed doses in the last month: 0  Specialty Medication: sofosbuvir -velpatasvir  400-100mg  QD  Patient is on additional specialty medications: No  Informant: patient              Were doses missed due to medication being on hold? No    Sofosbuvir -velpatasvir  400-100 mg: 12 days of medicine on hand       REFERRAL TO PHARMACIST     Referral to the pharmacist: Not needed      [facility]     Shipping address confirmed in Epic.     Cost and Payment: Patient has a copay of $5. They are aware and have authorized the pharmacy to charge the credit card on file.    Delivery Scheduled: Yes, Expected medication delivery date: 12/07/23.     Medication will be delivered via UPS to the prescription address in Epic [facility].    Aleck CHRISTELLA Gaskins, PharmD   [facility]  Specialty Pharmacist

## 2023-12-05 NOTE — Unmapped (Signed)
 Specialty Medication(s): sofosbuvir -velpatasvir     Gary Campbell has been dis-enrolled from the [facility] specialty pharmacy services as a result of therapy completion - expected therapy completion date: 01/14/24.    Additional information provided to the patient: advised pt to follow up with provider for SVR testing     Gary Campbell, PharmD  [facility] Specialty Pharmacist

## 2023-12-10 MED FILL — SOFOSBUVIR 400 MG-VELPATASVIR 100 MG TABLET: ORAL | 28 days supply | Qty: 28 | Fill #2

## 2023-12-17 MED ORDER — DOVATO 50 MG-300 MG TABLET
ORAL_TABLET | Freq: Every day | ORAL | 1 refills | 0.00000 days
Start: 2023-12-17 — End: ?

## 2023-12-31 DIAGNOSIS — B20 Human immunodeficiency virus [HIV] disease: Principal | ICD-10-CM
